# Patient Record
Sex: Male | Born: 1985 | Race: White | Hispanic: No | Marital: Single | Smoking: Never smoker
Health system: Southern US, Community
[De-identification: ages and names within clinical notes are randomized; demographics above are authoritative.]

## PROBLEM LIST (undated history)

## (undated) DIAGNOSIS — J302 Other seasonal allergic rhinitis: Secondary | ICD-10-CM

## (undated) DIAGNOSIS — Z8739 Personal history of other diseases of the musculoskeletal system and connective tissue: Secondary | ICD-10-CM

## (undated) DIAGNOSIS — Q210 Ventricular septal defect: Secondary | ICD-10-CM

## (undated) HISTORY — PX: TONSILLECTOMY: SUR1361

## (undated) HISTORY — DX: Other seasonal allergic rhinitis: J30.2

## (undated) HISTORY — DX: Ventricular septal defect: Q21.0

---
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
## 2009-05-25 ENCOUNTER — Emergency Department: Payer: Self-pay | Admitting: Emergency Medicine

## 2010-02-28 ENCOUNTER — Emergency Department (HOSPITAL_COMMUNITY): Admission: EM | Admit: 2010-02-28 | Discharge: 2010-02-28 | Payer: Self-pay | Admitting: Emergency Medicine

## 2010-07-18 ENCOUNTER — Ambulatory Visit: Payer: Self-pay | Admitting: Internal Medicine

## 2010-07-18 DIAGNOSIS — R21 Rash and other nonspecific skin eruption: Secondary | ICD-10-CM | POA: Insufficient documentation

## 2010-09-17 NOTE — Assessment & Plan Note (Signed)
Summary: NEW PT TO EST/JRR R/S FROM 07/22/10   Vital Signs:  Patient profile:   25 year old male Height:      68 inches Weight:      151.25 pounds BMI:     23.08 Temp:     98.2 degrees F oral Pulse rate:   60 / minute Pulse rhythm:   regular BP sitting:   118 / 78  (left arm) Cuff size:   regular  Vitals Entered By: Selena Batten Dance CMA (AAMA) (July 18, 2010 10:14 AM) CC: New patient to establish care   History of Present Illness: CC: new patinet, establish  1. works night shift x 2 1/2 years.  Started taking men's health vitamin.  wakes up at 7:30pm, work starts at 9pm, gets off at 6:30am.  asks what else he can do to stay healthy given working nights.  2.  rash - noted rash anterior forearms bilaterally, doesn't spread.  Gets worse when washing hands at work.  Itchy, worse during winter, better during summer.  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  h/o eczema on legs.  would like testicles checked.  preventative: flu - declines tetanus shot - 05/25/2009 Td has had blood work done before - for mono.   seat belt 100% time. monogamous, no h/o STDs.  -  Date:  06/14/2009    TD booster Td  Current Medications (verified): 1)  Triamcinolone Acetonide 0.1 % Oint (Triamcinolone Acetonide) .... Apply To Affected Area Forearms Bilaterally Twice Daily For 2 Wks, Large Op  Allergies (verified): 1)  ! 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    Physical Exam  General:  Well-developed,well-nourished,in no acute distress; alert,appropriate and cooperative throughout examination Head:  Normocephalic and atraumatic without obvious abnormalities. No apparent alopecia or balding. Eyes:  No corneal or conjunctival inflammation noted. EOMI. Perrla.  Ears:  External ear exam shows no significant lesions or deformities.  Otoscopic examination reveals clear canals, tympanic membranes are intact bilaterally without bulging, retraction, inflammation or discharge. Hearing is grossly normal bilaterally. Nose:  External nasal examination shows no deformity or inflammation. Nasal mucosa are pink and moist without lesions or exudates. Mouth:  Oral mucosa and oropharynx without lesions or exudates.  Teeth in good repair. Neck:  No deformities, masses, or tenderness noted. no LAD Lungs:  Normal respiratory effort, chest expands symmetrically. Lungs are clear to auscultation, no crackles or wheezes. Heart:  Normal rate and regular rhythm. S1 and S2 normal without gallop, click, rub or other extra sounds.  no murmur appreciated Abdomen:  Bowel sounds positive,abdomen soft and non-tender without masses, organomegaly or hernias noted. Genitalia:  Testes bilaterally descended without nodularity, tenderness or masses. No scrotal masses or lesions. No penis lesions or urethral discharge. Msk:  No deformity or scoliosis noted of thoracic or lumbar spine.   Pulses:  2+ rad pulses Extremities:  no pedal edema Neurologic:  CN grossly intact, station and gait intact Skin:  macular erythematous scaly rash on anterior medial forearms bilaterally, some macules with central clearing, occasionally pruritic Psych:  full affect, pleasant  conversant   Impression & Recommendations:  Problem # 1:  SKIN RASH (ICD-782.1) description consistent with eczema however clinically looks like tinea corporis.  KOH - negative.  treat with steroids, let us know if not improving in 1-2 wks.  His updated medication list for this problem includes:    Triamcinolone Acetonide 0.1 % Oint (Triamcinolone acetonide) .Marland Kitchen... Apply to affected area forearms bilaterally twice daily for 2 wks, large op  Orders: EMR Electrical engineer Code [facility])  Problem # 2:  HEALTH MAINTENANCE EXAM (ICD-V70.0) Reviewed preventive care protocols, scheduled due services, and updated immunizations.  declines flu, utd Td, will need Tdap next time.  Complete Medication List: 1)  Daily Mens Health Formula Tabs (Multiple vitamins-minerals) .... One daily 2)  Triamcinolone Acetonide 0.1 % Oint (Triamcinolone acetonide) .... Apply to affected area forearms bilaterally twice daily for 2 wks, large op  Patient Instructions: 1)  Triamcinolone cream for arms twice daily for 2 wks then stop. 2)  Return in 1-2 years for next CPE, sooner if needed. 3)  Good to meet you today.  Call clinic with quesitons. Prescriptions: TRIAMCINOLONE ACETONIDE 0.1 % OINT (TRIAMCINOLONE ACETONIDE) apply to affected area forearms bilaterally twice daily for 2 wks, large op  #1 x 3   Entered and Authorized by:   Eustaquio Boyden  MD   Signed by:   Eustaquio Boyden  MD on 07/18/2010   Method used:   Electronically to        [id number] (retail)       [address]       [place], [place]  [address]       Ph: [phone]       Fax: [phone]   RxID:   [id number]    Orders Added: 1)  New Patient 18-39 years [99385] 2)  EMR Misc Charge Code [EMRMisc]    Prior Medications: Current Allergies (reviewed today): ! PCN  Laboratory Results    Other Tests  Skin KOH: Negative    Appended Document: NEW PT TO EST/JRR R/S FROM 07/22/10 please call and notify skin KOH  negative for fungus- to try triamcinolone and let us know if not better or worsening.  Appended Document: NEW PT TO EST/JRR R/S FROM 07/22/10 Advised pt.

## 2010-11-27 ENCOUNTER — Encounter: Payer: Self-pay | Admitting: Family Medicine

## 2010-11-28 ENCOUNTER — Encounter: Payer: Self-pay | Admitting: Family Medicine

## 2010-11-28 ENCOUNTER — Ambulatory Visit (INDEPENDENT_AMBULATORY_CARE_PROVIDER_SITE_OTHER): Payer: Self-pay | Admitting: Family Medicine

## 2010-11-28 DIAGNOSIS — J309 Allergic rhinitis, unspecified: Secondary | ICD-10-CM

## 2010-11-28 DIAGNOSIS — Z2089 Contact with and (suspected) exposure to other communicable diseases: Secondary | ICD-10-CM

## 2010-11-28 DIAGNOSIS — Z20818 Contact with and (suspected) exposure to other bacterial communicable diseases: Secondary | ICD-10-CM

## 2010-11-28 DIAGNOSIS — J301 Allergic rhinitis due to pollen: Secondary | ICD-10-CM | POA: Insufficient documentation

## 2010-11-28 DIAGNOSIS — J029 Acute pharyngitis, unspecified: Secondary | ICD-10-CM

## 2010-11-28 LAB — POCT RAPID STREP A (OFFICE): Rapid Strep A Screen: POSITIVE — AB

## 2010-11-28 MED ORDER — MOMETASONE FUROATE 50 MCG/ACT NA SUSP: 2.0000 | Freq: Every day | NASAL | Status: DC

## 2010-11-28 MED ORDER — AZITHROMYCIN 250 MG PO TABS: ORAL_TABLET | ORAL | Status: AC

## 2010-11-28 MED ORDER — MONTELUKAST SODIUM 10 MG PO TABS: 10.0000 mg | ORAL_TABLET | Freq: Every day | ORAL | Status: DC

## 2010-11-28 MED ORDER — FLUTICASONE PROPIONATE 50 MCG/ACT NA SUSP: 2.0000 | Freq: Every day | NASAL | Status: DC

## 2010-11-28 NOTE — Assessment & Plan Note (Signed)
Rapid strep weakly positive, mild LAD, along with recent strep exposure.  Treat with zpack as allergic to PCN.

## 2010-11-28 NOTE — Progress Notes (Signed)
  Subjective:    Patient ID: Daniel Cooper, male    DOB: [birth year], 25 y.o.   MRN: [id number]  HPI CC: allegies?  Last week allergies worse also associated with HA, tightness in back of throat with tickle.  + dripping, congestion, pressure.  Works night shift, also cuts grass during day.  Mother mentioned seeing allergist.  Both RN and congestion.  Constant dripping out of nose, watery.  Mild PNDrainage.  Mild cough.  + sneezing.  + watery, itchy eyes.  Using allegra, pseudophed, and flonase nasal spray (prescribed by [facility]).  Along w mucinex.Has used claritin/zyrtec in past. Also using saline spray.  Using for about 1-2 mo.  Doesn't wear mask.  Also using eye drops (clear eyes?).  Last weekend, mother with strep throat.  Would like to be tested.  No fevers/chills, abd pain, n/v/d, SOB, wheezing.  Review of Systems Per HPI    Objective:   Physical Exam  Constitutional: He appears well-developed and well-nourished. No distress.  HENT:  Head: Normocephalic and atraumatic.  Right Ear: Hearing, tympanic membrane and external ear normal.  Left Ear: Hearing, tympanic membrane and external ear normal.  Nose: Mucosal edema and rhinorrhea present. No nasal deformity. No epistaxis. Right sinus exhibits no maxillary sinus tenderness and no frontal sinus tenderness. Left sinus exhibits no maxillary sinus tenderness and no frontal sinus tenderness.  Mouth/Throat: Uvula is midline and mucous membranes are normal. Posterior oropharyngeal erythema present. No oropharyngeal exudate or posterior oropharyngeal edema.       Cerumen in canals bilaterally Erythematous oropharynx, mild PND  Eyes: EOM are normal. Pupils are equal, round, and reactive to light. Right conjunctiva is injected. Right conjunctiva has no hemorrhage. Left conjunctiva is injected. Left conjunctiva has no hemorrhage.  Neck: Normal range of motion. Neck supple. No thyromegaly present.  Cardiovascular: Normal rate, regular rhythm, normal  heart sounds and intact distal pulses.   No murmur heard. Pulmonary/Chest: Effort normal and breath sounds normal. No respiratory distress. He has no wheezes. He has no rales.  Lymphadenopathy:    He has cervical adenopathy.       Right cervical: Superficial cervical adenopathy present.       Left cervical: No superficial cervical adenopathy present.          Assessment & Plan:

## 2010-11-28 NOTE — Patient Instructions (Addendum)
Good to see you today.  Call us with questions. For allergies - continue flonase 2 sprays in each nostril daily.  Continue allegra daily.  Continue mucinex +/- pseudophed.  Start singulair daily. If not better in next several days, change flonase to nasonex. If not better in next few weeks, give me a call for referral to allergist. Use mask when bush hogging, and ideally when mowing lawn. Strep positive - treat with zpack.

## 2010-11-28 NOTE — Assessment & Plan Note (Signed)
Already using daily antihistamine, INS.  Add singulair, rec regular use of nasal saline irrigation, and may change INS if no improvement after regular use.  If not better with these measures, referral to allergist for eval for xolair/IgE.

## 2010-11-29 ENCOUNTER — Other Ambulatory Visit: Payer: Self-pay | Admitting: *Deleted

## 2010-11-29 NOTE — Telephone Encounter (Signed)
Pt has been taking z-pack since Thursday.  He is now in [place] and left the zpack here.  He is asking if we can call in another one.  OK per Dr. Reece Agar.  Z-pack called to [facility] [place], phone [phone].

## 2010-11-29 NOTE — Telephone Encounter (Signed)
Noted. Thanks.

## 2011-02-23 ENCOUNTER — Other Ambulatory Visit: Payer: Self-pay | Admitting: Family Medicine

## 2011-04-05 ENCOUNTER — Other Ambulatory Visit: Payer: Self-pay | Admitting: Family Medicine

## 2011-05-15 ENCOUNTER — Ambulatory Visit: Payer: 59 | Admitting: Family Medicine

## 2011-06-04 ENCOUNTER — Ambulatory Visit (INDEPENDENT_AMBULATORY_CARE_PROVIDER_SITE_OTHER): Payer: 59 | Admitting: Family Medicine

## 2011-06-04 ENCOUNTER — Encounter: Payer: Self-pay | Admitting: Family Medicine

## 2011-06-04 VITALS — BP 116/72 | HR 64 | Temp 98.6°F | Wt 149.8 lb

## 2011-06-04 DIAGNOSIS — J40 Bronchitis, not specified as acute or chronic: Secondary | ICD-10-CM

## 2011-06-04 MED ORDER — AZITHROMYCIN 250 MG PO TABS: ORAL_TABLET | ORAL | Status: AC

## 2011-06-04 NOTE — Progress Notes (Signed)
  Subjective:    Patient ID: Daniel Cooper, male    DOB: 1986-01-12, 25 y.o.   MRN: [id number]  HPI CC: cough  4wks ago felt ill - ST, RN, chest tightness, HA.  After 1 wk improved, but lingering dry cough.  Productive in am of green sputum, then throat gets dry at end of day.  Last few days getting worse - more coughing in am and fatigue.  Last night didn't sleep well.  Feeling worn down.  HA improved.  So far has tried mucinex DM which helped cough.  No fever/chills, abd pain, n/v, rashes, ear pain or tooth pain.  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  + sick contacts at work.  No smokers at home.  Did have work induced asthma, now improved.  Review of Systems Per HPI    Objective:   Physical Exam  Nursing note and vitals reviewed. Constitutional: He appears well-developed and well-nourished. No distress.  HENT:  Head: Normocephalic and atraumatic.  Right Ear: Hearing, tympanic membrane, external ear and ear canal normal.  Left Ear: Hearing, tympanic membrane and external ear normal.  Nose: Nose normal. No mucosal edema or rhinorrhea. Right sinus exhibits no maxillary sinus tenderness and no frontal sinus tenderness. Left sinus exhibits no maxillary sinus tenderness and no frontal sinus tenderness.  Mouth/Throat: Uvula is midline and mucous membranes are normal. Posterior oropharyngeal erythema present. No oropharyngeal exudate, posterior oropharyngeal edema or tonsillar abscesses.  Eyes: Conjunctivae and EOM are normal. Pupils are equal, round, and reactive to light. No scleral icterus.  Neck: Normal range of motion. Neck supple.  Cardiovascular: Normal rate, regular rhythm, normal heart sounds and intact distal pulses.   No murmur heard. Pulmonary/Chest: Effort normal and breath sounds normal. No respiratory distress. He has no wheezes. He has no rales.  Musculoskeletal: He exhibits no edema.  Lymphadenopathy:    He has no cervical adenopathy.  Skin: Skin is warm and dry. No  rash noted.  Psychiatric: He has a normal mood and affect.          Assessment & Plan:

## 2011-06-04 NOTE — Patient Instructions (Signed)
This could be post infectious (post viral) cough or bronchitis. Take Zpack. Push fluids and plenty of rest. May continue mucinex if needed. Please return if you are not improving as expected, or if you have high fevers (>101.5) or difficulty swallowing or worsening productive cough. Call clinic with questions.  Good to see you today.

## 2011-06-04 NOTE — Assessment & Plan Note (Signed)
Postviral vs continued bronchitis. As worsened last 2 days, will cover with zpack for atypicals. Update [place] if not improving as expected. Pt declined cough syrup today.

## 2012-04-22 ENCOUNTER — Other Ambulatory Visit: Payer: Self-pay | Admitting: Family Medicine

## 2012-09-07 ENCOUNTER — Telehealth: Payer: Self-pay

## 2012-09-07 NOTE — Telephone Encounter (Signed)
Pt called to get correct spelling of PCP's name. Gave Dr Eustaquio Boyden.

## 2013-01-05 ENCOUNTER — Telehealth: Payer: Self-pay | Admitting: Family Medicine

## 2013-01-05 NOTE — Telephone Encounter (Signed)
Will see then. 

## 2013-01-05 NOTE — Telephone Encounter (Signed)
Patient Information:  Caller Name: Riely  Phone: [phone]  Patient: Daniel Cooper, Daniel Cooper  Gender: Male  DOB: 05-23-1986  Age: 27 Years  PCP: Eustaquio Boyden [facility])  Office Follow Up:  Does the office need to follow up with this patient?: No  Instructions For The Office: N/A  RN Note:  pt is requesting medication.  RN advised pt to be seen (pt has not been in the office since 2012)  Symptoms  Reason For Call & Symptoms: allergy medications are not helping anymore; runny nose and tight feeling behind nose  Reviewed Health History In EMR: Yes  Reviewed Medications In EMR: Yes  Reviewed Allergies In EMR: Yes  Reviewed Surgeries / Procedures: Yes  Date of Onset of Symptoms: 01/03/2013  Treatments Tried: singulair, mucinex, allegra  Treatments Tried Worked: No  Guideline(s) Used:  Hay Fever - Nasal Allergies  Disposition Per Guideline:   See Within 2 Weeks in Office  Reason For Disposition Reached:   Nasal allergies occur year-round  Advice Given:  N/A  Patient Will Follow Care Advice:  YES  Appointment Scheduled:  01/06/2013 09:00:00 Appointment Scheduled Provider:  Eustaquio Boyden (Family Practice)

## 2013-01-06 ENCOUNTER — Ambulatory Visit (INDEPENDENT_AMBULATORY_CARE_PROVIDER_SITE_OTHER): Payer: 59 | Admitting: Family Medicine

## 2013-01-06 ENCOUNTER — Encounter: Payer: Self-pay | Admitting: Family Medicine

## 2013-01-06 VITALS — BP 124/82 | HR 60 | Temp 98.4°F | Wt 154.5 lb

## 2013-01-06 DIAGNOSIS — M25539 Pain in unspecified wrist: Secondary | ICD-10-CM | POA: Insufficient documentation

## 2013-01-06 DIAGNOSIS — J309 Allergic rhinitis, unspecified: Secondary | ICD-10-CM

## 2013-01-06 MED ORDER — AZELASTINE HCL 0.1 % NA SOLN: 1.0000 | Freq: Two times a day (BID) | NASAL | Status: DC

## 2013-01-06 NOTE — Assessment & Plan Note (Signed)
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 Treat with stretching exerises and OTC NSAIDs - update if not improving as epoected. Grip strenght intact, no numbness or weakness on exam today.

## 2013-01-06 NOTE — Progress Notes (Signed)
  Subjective:    Patient ID: Daniel Cooper, male    DOB: 1986-05-10, 27 y.o.   MRN: [id number]  HPI CC: allergic rhintiis  Did well last year allergy wise.  Takes allegra, nasal saline, and singulair.  Also using plain mucinex 1200mg  bid.  Recently switched from claritin to allegra.  Not currently on flonase - off for the last year - felt that it was causing worsening allergy flares.  Attacks seem to happen more at work, also after mowing grass.  3-4 times nightly getting allergy attacks where feels itchy watery eyes, tightness and pressure in sinuses, rhinorrhea, PNdrainage, sneezing.  No cough or fever/chills.  Works night shift as Engineering geologist.  Also - bilateral wrist aches worsening recently.  Started after used nail gun - repetitive motion on hand.  Worse with push ups.  Past Medical History  Diagnosis Date  . VSD (ventricular septal defect)     told closed  . Routine general medical examination at a health care facility   . Rash and other nonspecific skin eruption   . Seasonal allergies      Review of Systems Per HPI    Objective:   Physical Exam  Nursing note and vitals reviewed. Constitutional: He appears well-developed and well-nourished. No distress.  HENT:  Head: Normocephalic and atraumatic.  Right Ear: Hearing, tympanic membrane, external ear and ear canal normal.  Left Ear: Hearing, tympanic membrane, external ear and ear canal normal.  Nose: Mucosal edema and rhinorrhea present. Right sinus exhibits no maxillary sinus tenderness and no frontal sinus tenderness. Left sinus exhibits no maxillary sinus tenderness and no frontal sinus tenderness.  Mouth/Throat: Uvula is midline, oropharynx is clear and moist and mucous membranes are normal. No oropharyngeal exudate, posterior oropharyngeal edema, posterior oropharyngeal erythema or tonsillar abscesses.  Oropharyngeal cobblestoning Slight conjunctival injection  Eyes: Conjunctivae and EOM are normal. Pupils are equal,  round, and reactive to light. No scleral icterus.  Neck: Normal range of motion. Neck supple.  Cardiovascular: Normal rate, regular rhythm, normal heart sounds and intact distal pulses.   No murmur heard. Pulmonary/Chest: Effort normal and breath sounds normal. No respiratory distress. He has no wheezes. He has no rales.  Musculoskeletal:  Mild discomfort to palpation bilateral dorsal medial wrists, o/w WNL.  No deformity or dislocation.  Lymphadenopathy:    He has no cervical adenopathy.  Skin: Skin is warm and dry. No rash noted.       Assessment & Plan:

## 2013-01-06 NOTE — Assessment & Plan Note (Signed)
Recently deteriorating. flonase worsened sxs. Add on astelin, continue other meds. Discussed allergen avoidance. If not improving, will refer to allergist for further eval, discussion of sublingual immunotherapy vs allergy shots. Pt agrees with plan.

## 2013-01-06 NOTE — Patient Instructions (Signed)
Good to see you today. Continue meds as up to now. Add on astelin nasal spray twice daily - 1 to 2 sprays. If not better with this, let me know for referral to allergist for testing and possible further treatment.

## 2013-05-16 ENCOUNTER — Other Ambulatory Visit: Payer: Self-pay | Admitting: Family Medicine

## 2014-03-06 ENCOUNTER — Other Ambulatory Visit: Payer: Self-pay | Admitting: Family Medicine

## 2014-04-17 ENCOUNTER — Other Ambulatory Visit: Payer: Self-pay | Admitting: Family Medicine

## 2014-05-02 ENCOUNTER — Encounter: Payer: Self-pay | Admitting: Family Medicine

## 2014-05-02 ENCOUNTER — Ambulatory Visit (INDEPENDENT_AMBULATORY_CARE_PROVIDER_SITE_OTHER): Payer: 59 | Admitting: Family Medicine

## 2014-05-02 VITALS — BP 132/76 | HR 61 | Temp 98.1°F | Resp 16 | Ht 68.0 in | Wt 161.1 lb

## 2014-05-02 DIAGNOSIS — J302 Other seasonal allergic rhinitis: Secondary | ICD-10-CM

## 2014-05-02 DIAGNOSIS — J3089 Other allergic rhinitis: Secondary | ICD-10-CM

## 2014-05-02 MED ORDER — MONTELUKAST SODIUM 10 MG PO TABS: ORAL_TABLET | ORAL | Status: DC

## 2014-05-02 NOTE — Patient Instructions (Addendum)
I've refilled singulair for you. Good to see you, I think you are doing well.  Allergic Rhinitis Allergic rhinitis is when the mucous membranes in the nose respond to allergens. Allergens are particles in the air that cause your body to have an allergic reaction. This causes you to release allergic antibodies. Through a chain of events, these eventually cause you to release histamine into the blood stream. Although meant to protect the body, it is this release of histamine that causes your discomfort, such as frequent sneezing, congestion, and an itchy, runny nose.  CAUSES  Seasonal allergic rhinitis (hay fever) is caused by pollen allergens that may come from grasses, trees, and weeds. Year-round allergic rhinitis (perennial allergic rhinitis) is caused by allergens such as house dust mites, pet dander, and mold spores.  SYMPTOMS   Nasal stuffiness (congestion).  Itchy, runny nose with sneezing and tearing of the eyes. DIAGNOSIS  Your health care provider can help you determine the allergen or allergens that trigger your symptoms. If you and your health care provider are unable to determine the allergen, skin or blood testing may be used. TREATMENT  Allergic rhinitis does not have a cure, but it can be controlled by:  Medicines and allergy shots (immunotherapy).  Avoiding the allergen. Hay fever may often be treated with antihistamines in pill or nasal spray forms. Antihistamines block the effects of histamine. There are over-the-counter medicines that may help with nasal congestion and swelling around the eyes. Check with your health care provider before taking or giving this medicine.  If avoiding the allergen or the medicine prescribed do not work, there are many new medicines your health care provider can prescribe. Stronger medicine may be used if initial measures are ineffective. Desensitizing injections can be used if medicine and avoidance does not work. Desensitization is when a  patient is given ongoing shots until the body becomes less sensitive to the allergen. Make sure you follow up with your health care provider if problems continue. HOME CARE INSTRUCTIONS It is not possible to completely avoid allergens, but you can reduce your symptoms by taking steps to limit your exposure to them. It helps to know exactly what you are allergic to so that you can avoid your specific triggers. SEEK MEDICAL CARE IF:   You have a fever.  You develop a cough that does not stop easily (persistent).  You have shortness of breath.  You start wheezing.  Symptoms interfere with normal daily activities. Document Released: 04/29/2001 Document Revised: 08/09/2013 Document Reviewed: 04/11/2013 [facility] Patient Information 2015 [place], [place]. This information is not intended to replace advice given to you by your health care provider. Make sure you discuss any questions you have with your health care provider.

## 2014-05-02 NOTE — Progress Notes (Signed)
   BP 132/76  Pulse 61  Temp(Src) 98.1 F (36.7 C) (Oral)  Resp 16  Ht  (1.727 m)  Wt 161 lb 1.9 oz (73.084 kg)  BMI 24.50 kg/m2  SpO2 99%   CC: med refill  Subjective:    Patient ID: Daniel Cooper, male    DOB: Dec 25, 1985, 28 y.o.   MRN: [id number]  HPI: Daniel Cooper is a 28 y.o. male presenting on 05/02/2014 for Medication Refill   Presents today for med refill visit. Works at [employer] works 3rd shift.  Seasonal allergies - regularly takes OTC antihistamine (zyrtec, claritin or allegra), singulair. Doesn't regularly use benadryl.   Relevant past medical, surgical, family and social history reviewed and updated as indicated.  Allergies and medications reviewed and updated. Current Outpatient Prescriptions on File Prior to Visit  Medication Sig  . diphenhydrAMINE (BENADRYL) 25 MG tablet Take 25 mg by mouth every 6 (six) hours as needed for itching.  . fexofenadine (ALLEGRA) 180 MG tablet Take 180 mg by mouth daily.    Marland Kitchen guaiFENesin (MUCINEX) 600 MG 12 hr tablet Take 1,200 mg by mouth 2 (two) times daily.   No current facility-administered medications on file prior to visit.    Review of Systems Per HPI unless specifically indicated above    Objective:    BP 132/76  Pulse 61  Temp(Src) 98.1 F (36.7 C) (Oral)  Resp 16  Ht  (1.727 m)  Wt 161 lb 1.9 oz (73.084 kg)  BMI 24.50 kg/m2  SpO2 99%  Physical Exam  Nursing note and vitals reviewed. Constitutional: He appears well-developed and well-nourished. No distress.  HENT:  Head: Normocephalic and atraumatic.  Right Ear: Hearing, tympanic membrane, external ear and ear canal normal.  Left Ear: Hearing, tympanic membrane, external ear and ear canal normal.  Nose: Nose normal. No mucosal edema or rhinorrhea. Right sinus exhibits no maxillary sinus tenderness and no frontal sinus tenderness. Left sinus exhibits no maxillary sinus tenderness and no frontal sinus  tenderness.  Mouth/Throat: Uvula is midline, oropharynx is clear and moist and mucous membranes are normal. No oropharyngeal exudate, posterior oropharyngeal edema, posterior oropharyngeal erythema or tonsillar abscesses.  Eyes: Conjunctivae and EOM are normal. Pupils are equal, round, and reactive to light. No scleral icterus.  Neck: Normal range of motion. Neck supple. No thyromegaly present.  Cardiovascular: Normal rate, regular rhythm, normal heart sounds and intact distal pulses.   No murmur heard. Pulmonary/Chest: Effort normal and breath sounds normal. No respiratory distress. He has no wheezes. He has no rales.  Lymphadenopathy:    He has no cervical adenopathy.  Skin: Skin is warm and dry. No rash noted.       Assessment & Plan:   Problem List Items Addressed This Visit   Allergic rhinitis - Primary     Stable on current regimen. Refilled singulair, continue OTC antihistamine Declines flu shot today.        Follow up plan: Return as needed.

## 2014-05-02 NOTE — Progress Notes (Signed)
Pre visit review using our clinic review tool, if applicable. No additional management support is needed unless otherwise documented below in the visit note. 

## 2014-05-02 NOTE — Assessment & Plan Note (Signed)
Stable on current regimen. Refilled singulair, continue OTC antihistamine Declines flu shot today.

## 2014-08-18 DIAGNOSIS — Z8739 Personal history of other diseases of the musculoskeletal system and connective tissue: Secondary | ICD-10-CM

## 2014-08-18 HISTORY — DX: Personal history of other diseases of the musculoskeletal system and connective tissue: Z87.39

## 2014-09-02 ENCOUNTER — Inpatient Hospital Stay (HOSPITAL_COMMUNITY)
Admission: EM | Admit: 2014-09-02 | Discharge: 2014-09-05 | DRG: 558 | Disposition: A | Discharge status: Home / Self Care | Payer: 59 | Attending: Internal Medicine | Admitting: Internal Medicine

## 2014-09-02 ENCOUNTER — Encounter (HOSPITAL_COMMUNITY): Payer: Self-pay | Admitting: Emergency Medicine

## 2014-09-02 DIAGNOSIS — M6282 Rhabdomyolysis: Secondary | ICD-10-CM | POA: Diagnosis present

## 2014-09-02 DIAGNOSIS — J302 Other seasonal allergic rhinitis: Secondary | ICD-10-CM | POA: Diagnosis present

## 2014-09-02 DIAGNOSIS — Z888 Allergy status to other drugs, medicaments and biological substances status: Secondary | ICD-10-CM | POA: Diagnosis not present

## 2014-09-02 DIAGNOSIS — R740 Nonspecific elevation of levels of transaminase and lactic acid dehydrogenase [LDH]: Secondary | ICD-10-CM | POA: Diagnosis present

## 2014-09-02 DIAGNOSIS — Z880 Allergy status to penicillin: Secondary | ICD-10-CM

## 2014-09-02 DIAGNOSIS — R7401 Elevation of levels of liver transaminase levels: Secondary | ICD-10-CM | POA: Diagnosis present

## 2014-09-02 HISTORY — DX: Personal history of other diseases of the musculoskeletal system and connective tissue: Z87.39

## 2014-09-02 LAB — HEPATIC FUNCTION PANEL
ALT: 252 U/L — ABNORMAL HIGH (ref 0–53)
AST: 1093 U/L — AB (ref 0–37)
Albumin: 3.6 g/dL (ref 3.5–5.2)
Alkaline Phosphatase: 38 U/L — ABNORMAL LOW (ref 39–117)
BILIRUBIN DIRECT: 0.3 mg/dL (ref 0.0–0.3)
BILIRUBIN INDIRECT: 1.3 mg/dL — AB (ref 0.3–0.9)
TOTAL PROTEIN: 5.4 g/dL — AB (ref 6.0–8.3)
Total Bilirubin: 1.6 mg/dL — ABNORMAL HIGH (ref 0.3–1.2)

## 2014-09-02 LAB — CBC WITH DIFFERENTIAL/PLATELET
Basophils Absolute: 0 10*3/uL (ref 0.0–0.1)
Basophils Relative: 0 % (ref 0–1)
Eosinophils Absolute: 0.1 10*3/uL (ref 0.0–0.7)
Eosinophils Relative: 1 % (ref 0–5)
HCT: 42.9 % (ref 39.0–52.0)
Hemoglobin: 15.3 g/dL (ref 13.0–17.0)
LYMPHS PCT: 19 % (ref 12–46)
Lymphs Abs: 1.2 10*3/uL (ref 0.7–4.0)
MCH: 30.5 pg (ref 26.0–34.0)
MCHC: 35.7 g/dL (ref 30.0–36.0)
MCV: 85.5 fL (ref 78.0–100.0)
MONOS PCT: 7 % (ref 3–12)
Monocytes Absolute: 0.5 10*3/uL (ref 0.1–1.0)
NEUTROS ABS: 4.5 10*3/uL (ref 1.7–7.7)
Neutrophils Relative %: 73 % (ref 43–77)
Platelets: 163 10*3/uL (ref 150–400)
RBC: 5.02 MIL/uL (ref 4.22–5.81)
RDW: 12.5 % (ref 11.5–15.5)
WBC: 6.2 10*3/uL (ref 4.0–10.5)

## 2014-09-02 LAB — CBC
HEMATOCRIT: 41.4 % (ref 39.0–52.0)
HEMOGLOBIN: 14.2 g/dL (ref 13.0–17.0)
MCH: 29.2 pg (ref 26.0–34.0)
MCHC: 34.3 g/dL (ref 30.0–36.0)
MCV: 85 fL (ref 78.0–100.0)
PLATELETS: 145 10*3/uL — AB (ref 150–400)
RBC: 4.87 MIL/uL (ref 4.22–5.81)
RDW: 12.5 % (ref 11.5–15.5)
WBC: 4.6 10*3/uL (ref 4.0–10.5)

## 2014-09-02 LAB — CK

## 2014-09-02 LAB — URINALYSIS, ROUTINE W REFLEX MICROSCOPIC
Bilirubin Urine: NEGATIVE
GLUCOSE, UA: NEGATIVE mg/dL
Ketones, ur: NEGATIVE mg/dL
Leukocytes, UA: NEGATIVE
Nitrite: NEGATIVE
Protein, ur: 30 mg/dL — AB
Specific Gravity, Urine: 1.022 (ref 1.005–1.030)
UROBILINOGEN UA: 1 mg/dL (ref 0.0–1.0)
pH: 6.5 (ref 5.0–8.0)

## 2014-09-02 LAB — CREATININE, SERUM
CREATININE: 0.83 mg/dL (ref 0.50–1.35)
GFR calc Af Amer: >90 mL/min (ref >90)

## 2014-09-02 LAB — BASIC METABOLIC PANEL
ANION GAP: 7 (ref 5–15)
BUN: 23 mg/dL (ref 6–23)
CALCIUM: 8.8 mg/dL (ref 8.4–10.5)
CO2: 27 mmol/L (ref 19–32)
Chloride: 104 mEq/L (ref 96–112)
Creatinine, Ser: 1.16 mg/dL (ref 0.50–1.35)
GFR calc non Af Amer: 85 mL/min — ABNORMAL LOW (ref >90)
Glucose, Bld: 100 mg/dL — ABNORMAL HIGH (ref 70–99)
POTASSIUM: 4.1 mmol/L (ref 3.5–5.1)
Sodium: 138 mmol/L (ref 135–145)

## 2014-09-02 LAB — URINE MICROSCOPIC-ADD ON

## 2014-09-02 LAB — TROPONIN I

## 2014-09-02 LAB — TSH: TSH: 0.78 u[IU]/mL (ref 0.350–4.500)

## 2014-09-02 MED ORDER — ZOLPIDEM TARTRATE 5 MG PO TABS: 5.0000 mg | ORAL_TABLET | Freq: Every evening | ORAL | Status: DC | PRN

## 2014-09-02 MED ORDER — MONTELUKAST SODIUM 10 MG PO TABS
10.0000 mg | ORAL_TABLET | Freq: Every day | ORAL | Status: DC
  Filled 2014-09-02 (×2): qty 1

## 2014-09-02 MED ORDER — OXYCODONE HCL 5 MG PO TABS: 5.0000 mg | ORAL_TABLET | ORAL | Status: DC | PRN

## 2014-09-02 MED ORDER — ACETAMINOPHEN 325 MG PO TABS: 650.0000 mg | ORAL_TABLET | Freq: Four times a day (QID) | ORAL | Status: DC | PRN

## 2014-09-02 MED ORDER — ONDANSETRON HCL 4 MG/2ML IJ SOLN: 4.0000 mg | Freq: Four times a day (QID) | INTRAMUSCULAR | Status: DC | PRN

## 2014-09-02 MED ORDER — ONDANSETRON HCL 4 MG PO TABS: 4.0000 mg | ORAL_TABLET | Freq: Four times a day (QID) | ORAL | Status: DC | PRN

## 2014-09-02 MED ORDER — SENNOSIDES-DOCUSATE SODIUM 8.6-50 MG PO TABS: 1.0000 | ORAL_TABLET | Freq: Every evening | ORAL | Status: DC | PRN

## 2014-09-02 MED ORDER — SODIUM CHLORIDE 0.9 % IV BOLUS (SEPSIS)
1000.0000 mL | Freq: Once | INTRAVENOUS | Status: AC
  Administered 2014-09-02: 1000 mL via INTRAVENOUS

## 2014-09-02 MED ORDER — ALUM & MAG HYDROXIDE-SIMETH 200-200-20 MG/5ML PO SUSP: 30.0000 mL | Freq: Four times a day (QID) | ORAL | Status: DC | PRN

## 2014-09-02 MED ORDER — ACETAMINOPHEN 650 MG RE SUPP: 650.0000 mg | Freq: Four times a day (QID) | RECTAL | Status: DC | PRN

## 2014-09-02 MED ORDER — SODIUM CHLORIDE 0.9 % IV SOLN
INTRAVENOUS | Status: DC
  Administered 2014-09-02 – 2014-09-04 (×10): via INTRAVENOUS

## 2014-09-02 MED ORDER — MORPHINE SULFATE 2 MG/ML IJ SOLN: 2.0000 mg | INTRAMUSCULAR | Status: DC | PRN

## 2014-09-02 MED ORDER — ENOXAPARIN SODIUM 40 MG/0.4ML ~~LOC~~ SOLN
40.0000 mg | SUBCUTANEOUS | Status: DC
  Administered 2014-09-02 – 2014-09-04 (×3): 40 mg via SUBCUTANEOUS
  Filled 2014-09-02 (×4): qty 0.4

## 2014-09-02 NOTE — ED Provider Notes (Signed)
Patient seen/examined in the Emergency Department in conjunction with Midlevel Provider  Patient reports muscle pain - he is in rhabdomyolysis Exam : awake/alert, no distress Plan: admit to medicine for IV fluid rehydration   Joya Gaskinsonald W Ulus Hazen, MD 09/02/14 [phone]

## 2014-09-02 NOTE — ED Notes (Signed)
Attempted report 

## 2014-09-02 NOTE — ED Provider Notes (Signed)
CSN: [id number]     Arrival date & time 09/02/14  0241 History   First MD Initiated Contact with Patient 09/02/14 0410     Chief Complaint  Patient presents with  . Muscle Pain     (Consider location/radiation/quality/duration/timing/severity/associated sxs/prior Treatment) HPI Comments: Patient is a 29 year old male with no past medical history who presents with generalized muscle pain that started yesterday. Patient reports not working out in a while and decided to do 2 strenuous workouts for the past 2 days. Patient reports generalized muscle pain that is constant and severe without radiation. Patient became concerned when his urine was "coke colored." no aggravating/alleviating factors. No other associated symptoms.   Patient is a 29 y.o. male presenting with musculoskeletal pain. The history is provided by the patient. No language interpreter was used.  Muscle Pain This is a new problem. The current episode started today. The problem occurs constantly. The problem has been unchanged. Associated symptoms include myalgias. Pertinent negatives include no abdominal pain, arthralgias, chest pain, chills, fatigue, fever, nausea, neck pain, vomiting or weakness. Nothing aggravates the symptoms. He has tried nothing for the symptoms. The treatment provided no relief.    Past Medical History  Diagnosis Date  . VSD (ventricular septal defect)     told closed  . Seasonal allergies    Past Surgical History  Procedure Laterality Date  . Tonsillectomy     Family History  Problem Relation Age of Onset  . Stroke Maternal Uncle 40  . Prostate cancer Paternal Grandfather 81  . Coronary artery disease Maternal Grandmother     CABG; +smoker  . Hypertension      some in family   History  Substance Use Topics  . Smoking status: Never Smoker   . Smokeless tobacco: Never Used  . Alcohol Use: Yes     Comment: Occasional    Review of Systems  Constitutional: Negative for fever, chills and  fatigue.  HENT: Negative for trouble swallowing.   Eyes: Negative for visual disturbance.  Respiratory: Negative for shortness of breath.   Cardiovascular: Negative for chest pain and palpitations.  Gastrointestinal: Negative for nausea, vomiting, abdominal pain and diarrhea.  Genitourinary: Negative for dysuria and difficulty urinating.       Dark urine  Musculoskeletal: Positive for myalgias. Negative for arthralgias and neck pain.  Skin: Negative for color change.  Neurological: Negative for dizziness and weakness.  Psychiatric/Behavioral: Negative for dysphoric mood.      Allergies  Flonase and Penicillins  Home Medications   Prior to Admission medications   Medication Sig Start Date End Date Taking? Authorizing Provider  diphenhydrAMINE (BENADRYL) 25 MG tablet Take 25 mg by mouth every 6 (six) hours as needed for itching.    Historical Provider, MD  fexofenadine (ALLEGRA) 180 MG tablet Take 180 mg by mouth daily.      Historical Provider, MD  guaiFENesin (MUCINEX) 600 MG 12 hr tablet Take 1,200 mg by mouth 2 (two) times daily.    Historical Provider, MD  montelukast (SINGULAIR) 10 MG tablet Take one tablet at bedtime 05/02/14   Eustaquio Boyden, MD   BP 124/52 mmHg  Pulse 78  Temp(Src) 98.3 F (36.8 C) (Oral)  Resp 22  Ht  (1.727 m)  Wt 154 lb (69.854 kg)  BMI 23.42 kg/m2  SpO2 98% Physical Exam  Constitutional: He is oriented to person, place, and time. He appears well-developed and well-nourished. No distress.  HENT:  Head: Normocephalic and atraumatic.  Eyes: Conjunctivae  and EOM are normal.  Neck: Normal range of motion.  Cardiovascular: Normal rate and regular rhythm.  Exam reveals no gallop and no friction rub.   No murmur heard. Pulmonary/Chest: Effort normal and breath sounds normal. He has no wheezes. He has no rales. He exhibits no tenderness.  Abdominal: Soft. He exhibits no distension. There is no tenderness. There is no rebound.  Musculoskeletal:  Normal range of motion.  Neurological: He is alert and oriented to person, place, and time. Coordination normal.  Speech is goal-oriented. Moves limbs without ataxia.   Skin: Skin is warm and dry.  Psychiatric: He has a normal mood and affect. His behavior is normal.  Nursing note and vitals reviewed.   ED Course  Procedures (including critical care time) Labs Review Labs Reviewed  CK - Abnormal; Notable for the following:    Total CK >50000 (*)    All other components within normal limits  BASIC METABOLIC PANEL - Abnormal; Notable for the following:    Glucose, Bld 100 (*)    GFR calc non Af Amer 85 (*)    All other components within normal limits  URINALYSIS, ROUTINE W REFLEX MICROSCOPIC - Abnormal; Notable for the following:    Color, Urine AMBER (*)    Hgb urine dipstick LARGE (*)    Protein, ur 30 (*)    All other components within normal limits  HEPATIC FUNCTION PANEL - Abnormal; Notable for the following:    Total Protein 5.4 (*)    AST 1093 (*)    ALT 252 (*)    Alkaline Phosphatase 38 (*)    Total Bilirubin 1.6 (*)    Indirect Bilirubin 1.3 (*)    All other components within normal limits  CBC - Abnormal; Notable for the following:    Platelets 145 (*)    All other components within normal limits  COMPREHENSIVE METABOLIC PANEL - Abnormal; Notable for the following:    Total Protein 5.4 (*)    Albumin 3.3 (*)    AST 1044 (*)    ALT 276 (*)    Alkaline Phosphatase 38 (*)    Total Bilirubin 1.9 (*)    All other components within normal limits  CK TOTAL AND CKMB - Abnormal; Notable for the following:    Total CK >50000 (*)    CK, MB 47.0 (*)    All other components within normal limits  LIPID PANEL - Abnormal; Notable for the following:    HDL 37 (*)    All other components within normal limits  CK TOTAL AND CKMB - Abnormal; Notable for the following:    Total CK 34749 (*)    CK, MB 24.7 (*)    All other components within normal limits  COMPREHENSIVE  METABOLIC PANEL - Abnormal; Notable for the following:    Total Protein 5.4 (*)    Albumin 3.2 (*)    AST 892 (*)    ALT 295 (*)    All other components within normal limits  CBC WITH DIFFERENTIAL  URINE MICROSCOPIC-ADD ON  CREATININE, SERUM  TSH  TROPONIN I  CBC  PROTIME-INR  APTT  HEPATITIS PANEL, ACUTE  URINE RAPID DRUG SCREEN (HOSP PERFORMED)  MYOGLOBIN, SERUM  ALDOLASE    Imaging Review No results found.   EKG Interpretation None      MDM   Final diagnoses:  Non-traumatic rhabdomyolysis   4:11 AM Labs and urinalysis pending. Vitals stable and patient afebrile. Patient will have IV hydration.   Patient's  CK elevated >50,000. Patient will be admitted. Creatinine stable without elevation although we have no baseline value for comparison.   Emilia Beck, PA-C 09/04/14 1209  Joya Gaskins, MD 09/06/14 [phone]

## 2014-09-02 NOTE — ED Notes (Signed)
Spoke with main laboratory personnel regarding CK and myoglobin levels have been pending for 2.5 hours. Lab personnel reported that they are currently on the 3rd dilution of the sample to determine CK level. Information reported to BristolKaitlyn, GeorgiaPA and CrawfordOni, MD.

## 2014-09-02 NOTE — ED Notes (Signed)
Patient here with complaint of severe muscle soreness secondary to strenuous workouts. States that he worked out [employer] morning. Shoulders and triceps have become very sore since that time. Patient assumed it was normal muscle soreness until yesterday when his urine appeared to be "coke" colored. States he drank large volume of water and urine changed to tea colored.

## 2014-09-02 NOTE — H&P (Signed)
Patient Demographics  Daniel Cooper, is a 29 y.o. male  MRN: [id number]   DOB - June 08, 1986  Admit Date - 09/02/2014  Outpatient Primary MD for the patient is Eustaquio Boyden, MD   Assessment Daniel Cooper is a pleasant 29 year old male with history of seasonal allergies/VSD in childhood who comes inafter he noticed some Coca-Cola colored urine early this morning, associated with generalized muscle weakness, and he was found to have rhabdomyolysis with CPK greater than 50,000 as well as evidence of myoglobin in the urine. The rhabdomyolysis seems to have been triggered by a vigorous body workout a few days ago. He also took some protein supplements/herbal supplements with the workout and it is not clear whether these may have contributed. Fortunately, his renal function is normal at present. He had otherwise been in good health until the workout. He reports that a family friend's child had a viral illness recently but he himself did not. Denies use of statins. Will therefore assume that this is spontaneous rhabdomyolysis resulting from vigorous muscle exercise but cautioned patient that he may need further evaluation for other muscle related conditions if there is recurrence. He will be admitted for IV fluids and electrolyte replacements as necessary. Will check LFTs/TSH/lipid panel/troponin I/EKG and consider further investigations depending on clinical progress. Plan   Rhabdomyolysis  Admit MedSurg  Check LFTs/TSH/lipid panel/troponin I/EKG  Monitor CPK/CK-MB.  IV fluids Seasonal allergies  Continue Singulair DVT Prophylaxis    Lovenox   AM Labs Ordered, also please review Full Orders  Family Communication: Admission, patients condition and plan of care including tests being ordered have been discussed with the patient and mother who indicate understanding and agree with the plan and Code Status.  Code Status   Full code  Likely DC to  home  Condition GUARDED    Time  spent in minutes : 40 minutes       With History of -  Past Medical History  Diagnosis Date  . VSD (ventricular septal defect)     told closed  . Seasonal allergies       Past Surgical History  Procedure Laterality Date  . Tonsillectomy      in for   Chief Complaint  Patient presents with  . Muscle Pain     HPI  Daniel Cooper  is a 29 y.o. male presenting to the ED after he noticed Coca-Cola colored urine associated with generalized muscle aches after a vigorous body workout 3 days ago. Each session of the workout over a 2 day period lasted at least 45 minutes. He also took some protein supplements with it. He is an otherwise active Games developer. He has worked out in the past but this has not been an issue. He just hadn't worked out in a long time. He denies nausea, vomiting or diarrhea. No shortness of breath, chest pain or cough. No fever. No dysuria.   Review of Systems    In addition to the HPI above,  No Fever-chills, No Headache, No changes with Vision or hearing, No problems swallowing food or Liquids, No Chest pain, Cough or Shortness of Breath, No Abdominal pain, No Nausea or Vommitting, Bowel movements are regular, No Blood in stool or Urine(Coca-Cola colored), No dysuria, No new skin rashes or bruises, No new joints pains-aches,  No recent weight gain or loss, No polyuria, polydypsia or polyphagia, No significant Mental Stressors.  A full 10 point Review of Systems was done, except as stated above, all other Review  of Systems were negative.   Social History History  Substance Use Topics  . Smoking status: Never Smoker   . Smokeless tobacco: Never Used  . Alcohol Use: Yes     Comment: Occasional    Family History Family History  Problem Relation Age of Onset  . Stroke Maternal Uncle 40  . Prostate cancer Paternal Grandfather 27  . Coronary artery disease Maternal Grandmother     CABG; +smoker  . Hypertension      some in family      Prior to Admission medications   Medication Sig Start Date End Date Taking? Authorizing Provider  diphenhydrAMINE (BENADRYL) 25 MG tablet Take 25 mg by mouth every 6 (six) hours as needed for itching.   Yes Historical Provider, MD  fexofenadine (ALLEGRA) 180 MG tablet Take 180 mg by mouth daily.     Yes Historical Provider, MD  montelukast (SINGULAIR) 10 MG tablet Take one tablet at bedtime 05/02/14  Yes Eustaquio Boyden, MD    Allergies  Allergen Reactions  . Flonase [Fluticasone Propionate] Other (See Comments)    Worsened allergy symptoms  . Penicillins     REACTION: hives    Physical Exam  Vitals  Blood pressure 115/56, pulse 69, temperature 98.3 F (36.8 C), temperature source Oral, resp. rate 16, height  (1.727 m), weight 69.854 kg (154 lb), SpO2 100 %.   1. General  lying in bed in NAD,   2. Normal affect and insight, Not Suicidal or Homicidal, Awake Alert, Oriented X 3.  3. No F.N deficits, ALL C.Nerves Intact, Strength 5/5 all 4 extremities, Sensation intact all 4 extremities, Plantars down going.  4. Ears and Eyes appear Normal, Conjunctivae clear, PERRLA. Moist Oral Mucosa.  5. Supple Neck, No JVD, No cervical lymphadenopathy appriciated, No Carotid Bruits.  6. Symmetrical Chest wall movement, Good air movement bilaterally, CTAB.  7. RRR, No Gallops, Rubs or Murmurs, No Parasternal Heave.  8. Positive Bowel Sounds, Abdomen Soft, Non tender, No organomegaly appriciated,No rebound -guarding or rigidity.  9.  No Cyanosis, Normal Skin Turgor, No Skin Rash or Bruise.  10. Good muscle tone,  joints appear normal , no effusions, Normal ROM.  11. No Palpable Lymph Nodes in Neck or Axillae  Data Review  CBC  Recent Labs Lab 09/02/14 0304  WBC 6.2  HGB 15.3  HCT 42.9  PLT 163  MCV 85.5  MCH 30.5  MCHC 35.7  RDW 12.5  LYMPHSABS 1.2  MONOABS 0.5  EOSABS 0.1  BASOSABS 0.0    ------------------------------------------------------------------------------------------------------------------  Chemistries   Recent Labs Lab 09/02/14 0304  NA 138  K 4.1  CL 104  CO2 27  GLUCOSE 100*  BUN 23  CREATININE 1.16  CALCIUM 8.8   ------------------------------------------------------------------------------------------------------------------ estimated creatinine clearance is 92.5 mL/min (by C-G formula based on Cr of 1.16). ------------------------------------------------------------------------------------------------------------------ No results for input(s): TSH, T4TOTAL, T3FREE, THYROIDAB in the last 72 hours.  Invalid input(s): FREET3   Coagulation profile No results for input(s): INR, PROTIME in the last 168 hours. ------------------------------------------------------------------------------------------------------------------- No results for input(s): DDIMER in the last 72 hours. -------------------------------------------------------------------------------------------------------------------  Cardiac Enzymes No results for input(s): CKMB, TROPONINI, MYOGLOBIN in the last 168 hours.  Invalid input(s): CK ------------------------------------------------------------------------------------------------------------------ Invalid input(s): POCBNP   ---------------------------------------------------------------------------------------------------------------  Urinalysis    Component Value Date/Time   COLORURINE AMBER* 09/02/2014 0422   APPEARANCEUR CLEAR 09/02/2014 0422   LABSPEC 1.022 09/02/2014 0422   PHURINE 6.5 09/02/2014 0422   GLUCOSEU NEGATIVE 09/02/2014 0422   HGBUR LARGE* 09/02/2014 0422  BILIRUBINUR NEGATIVE 09/02/2014 0422   KETONESUR NEGATIVE 09/02/2014 0422   PROTEINUR 30* 09/02/2014 0422   UROBILINOGEN 1.0 09/02/2014 0422   NITRITE NEGATIVE 09/02/2014 0422   LEUKOCYTESUR NEGATIVE 09/02/2014 0422     ----------------------------------------------------------------------------------------------------------------  Imaging results:   No results found.      Otilia Kareem M.D on 09/02/2014 at 8:04 AM  Between 7am to 7pm - Pager - [phone]  After 7pm go to [email or web address] - password [email or web address]  And look for the night coverage person covering me after hours  [facility] Office  [phone]

## 2014-09-03 DIAGNOSIS — R740 Nonspecific elevation of levels of transaminase and lactic acid dehydrogenase [LDH]: Secondary | ICD-10-CM

## 2014-09-03 DIAGNOSIS — R7401 Elevation of levels of liver transaminase levels: Secondary | ICD-10-CM | POA: Diagnosis present

## 2014-09-03 LAB — RAPID URINE DRUG SCREEN, HOSP PERFORMED
AMPHETAMINES: NOT DETECTED
Barbiturates: NOT DETECTED
Benzodiazepines: NOT DETECTED
COCAINE: NOT DETECTED
Opiates: NOT DETECTED
Tetrahydrocannabinol: NOT DETECTED

## 2014-09-03 LAB — COMPREHENSIVE METABOLIC PANEL
ALK PHOS: 38 U/L — AB (ref 39–117)
ALT: 276 U/L — ABNORMAL HIGH (ref 0–53)
AST: 1044 U/L — ABNORMAL HIGH (ref 0–37)
Albumin: 3.3 g/dL — ABNORMAL LOW (ref 3.5–5.2)
Anion gap: 6 (ref 5–15)
BUN: 6 mg/dL (ref 6–23)
CO2: 27 mmol/L (ref 19–32)
Calcium: 8.6 mg/dL (ref 8.4–10.5)
Chloride: 109 mEq/L (ref 96–112)
Creatinine, Ser: 0.9 mg/dL (ref 0.50–1.35)
GFR calc Af Amer: >90 mL/min (ref >90)
GLUCOSE: 94 mg/dL (ref 70–99)
Potassium: 4.5 mmol/L (ref 3.5–5.1)
Sodium: 142 mmol/L (ref 135–145)
Total Bilirubin: 1.9 mg/dL — ABNORMAL HIGH (ref 0.3–1.2)
Total Protein: 5.4 g/dL — ABNORMAL LOW (ref 6.0–8.3)

## 2014-09-03 LAB — CBC
HCT: 41.5 % (ref 39.0–52.0)
HEMOGLOBIN: 14.2 g/dL (ref 13.0–17.0)
MCH: 29.3 pg (ref 26.0–34.0)
MCHC: 34.2 g/dL (ref 30.0–36.0)
MCV: 85.6 fL (ref 78.0–100.0)
Platelets: 151 10*3/uL (ref 150–400)
RBC: 4.85 MIL/uL (ref 4.22–5.81)
RDW: 12.5 % (ref 11.5–15.5)
WBC: 4.6 10*3/uL (ref 4.0–10.5)

## 2014-09-03 LAB — CK TOTAL AND CKMB (NOT AT ARMC)
CK, MB: 47 ng/mL (ref 0.3–4.0)
Total CK: >50000 U/L — ABNORMAL HIGH (ref 7–232)

## 2014-09-03 LAB — PROTIME-INR
INR: 1.01 (ref 0.00–1.49)
PROTHROMBIN TIME: 13.4 s (ref 11.6–15.2)

## 2014-09-03 LAB — HEPATITIS PANEL, ACUTE
HCV Ab: NEGATIVE
HEP A IGM: NONREACTIVE
HEP B C IGM: NONREACTIVE
Hepatitis B Surface Ag: NEGATIVE

## 2014-09-03 LAB — APTT: aPTT: 29 seconds (ref 24–37)

## 2014-09-03 LAB — LIPID PANEL
Cholesterol: 114 mg/dL (ref 0–200)
HDL: 37 mg/dL — ABNORMAL LOW (ref >39)
LDL Cholesterol: 69 mg/dL (ref 0–99)
Total CHOL/HDL Ratio: 3.1 RATIO
Triglycerides: 41 mg/dL (ref <150)
VLDL: 8 mg/dL (ref 0–40)

## 2014-09-03 NOTE — Progress Notes (Signed)
CRITICAL VALUE ALERT  Critical value received: CK MB 47  Date of notification: 09/03/2014  Time of notification:  0728  Critical value read back:Yes.    Nurse who received alert:  Salvadore OxfordJessica Marque Bango  MD notified (1st page):  Dr. Venetia Constableanga Time of first page:  [phone]  MD notified (2nd page):  Time of second page:  Responding MD:  Dr. Venetia Constableanga  Time MD responded:  0740  MD ordered to continue IV fluids. Normal saline at 150 cc's/hr.

## 2014-09-03 NOTE — Progress Notes (Signed)
[facility] PROGRESS NOTE  Daniel Cooper WUJ:[id number] DOB: [birth year] DOA: 09/02/2014 PCP: Daniel Boyden, MD  Assessment Daniel Cooper is a pleasant 29 year old male with history of seasonal allergies/VSD in childhood who came in after he noticed some Coca-Cola colored urine associated with generalized muscle weakness after a vigorous body work out, and he was found to have rhabdomyolysis with CPK greater than 50,000. The thinking is that the muscle work out precipitated this episode, which is the first. He reports feeling better on IVF. LFTs are elevated but this is likely due to rhabdomyolysis. In any case, will check Hepatitis panel and drug urine screen. CPK remains greater than 50,000 but patient reports less muscle pains. Renal function remains normal. Will continue hydration. Will not pursue further studies at other than basic screening tests at this point as this is the first such event with an apparent precipitating factor. Plan  Rhabdomyolysis/Transaminitis  Hepatitis panel/Utox  Monitor CPK/CK-MB/LFTs.  IV fluids Seasonal allergies  Seem under control. Hold singular in view of rhabdo. DVT Prophylaxis   Lovenox  Family Communication: Discussed with both parents at bedside..  Code Status   Full code  Likely DC to home Objective: Filed Vitals:   09/03/14 0622  BP: 117/73  Pulse: 55  Temp: 97.7 F (36.5 C)  Resp: 16    Intake/Output Summary (Last 24 hours) at 09/03/14 0935 Last data filed at 09/03/14 0729  Gross per 24 hour  Intake   1405 ml  Output   3950 ml  Net  -2545 ml   Filed Weights   09/02/14 0251 09/02/14 0859  Weight: 69.854 kg (154 lb) 69.854 kg (154 lb)    Exam:   General:  Sitting up, not in distress.  Cardiovascular: RRR. S1S2 normal. No murmurs.  Respiratory: Lungs clear.  Abdomen: soft and non tender. Bowel sounds normal.  Musculoskeletal: No pedal edema.   Data Reviewed: Basic Metabolic Panel:  Recent  Labs Lab 09/02/14 0304 09/02/14 0958 09/03/14 0351  NA 138  --  142  K 4.1  --  4.5  CL 104  --  109  CO2 27  --  27  GLUCOSE 100*  --  94  BUN 23  --  6  CREATININE 1.16 0.83 0.90  CALCIUM 8.8  --  8.6   Liver Function Tests:  Recent Labs Lab 09/02/14 0958 09/03/14 0351  AST 1093* 1044*  ALT 252* 276*  ALKPHOS 38* 38*  BILITOT 1.6* 1.9*  PROT 5.4* 5.4*  ALBUMIN 3.6 3.3*   No results for input(s): LIPASE, AMYLASE in the last 168 hours. No results for input(s): AMMONIA in the last 168 hours. CBC:  Recent Labs Lab 09/02/14 0304 09/02/14 0958 09/03/14 0351  WBC 6.2 4.6 4.6  NEUTROABS 4.5  --   --   HGB 15.3 14.2 14.2  HCT 42.9 41.4 41.5  MCV 85.5 85.0 85.6  PLT 163 145* 151   Cardiac Enzymes:  Recent Labs Lab 09/02/14 0304 09/02/14 0958 09/03/14 0351  CKTOTAL >50000*  --  >50000*  CKMB  --   --  47.0*  TROPONINI  --  <0.03  --    BNP (last 3 results) No results for input(s): PROBNP in the last 8760 hours. CBG: No results for input(s): GLUCAP in the last 168 hours.  No results found for this or any previous visit (from the past 240 hour(s)).   Studies: No results found.  Scheduled Meds: . enoxaparin (LOVENOX) injection  40 mg Subcutaneous Q24H  Continuous Infusions: . sodium chloride 175 mL/hr at 09/03/14 [id number]    Active Problems:   Rhabdomyolysis   Seasonal allergies    Time spent: 20 minutes.    Daniel Cooper  [facility] Pager [phone]. If 7PM-7AM, please contact night-coverage at [email or web address], password [email or web address] 09/03/2014, 9:35 AM  LOS: 1 day

## 2014-09-04 LAB — CK TOTAL AND CKMB (NOT AT ARMC)
CK, MB: 24.7 ng/mL (ref 0.3–4.0)
Relative Index: 0.1 (ref 0.0–2.5)
Total CK: 34749 U/L — ABNORMAL HIGH (ref 7–232)

## 2014-09-04 LAB — COMPREHENSIVE METABOLIC PANEL
ALT: 295 U/L — ABNORMAL HIGH (ref 0–53)
AST: 892 U/L — AB (ref 0–37)
Albumin: 3.2 g/dL — ABNORMAL LOW (ref 3.5–5.2)
Alkaline Phosphatase: 41 U/L (ref 39–117)
Anion gap: 8 (ref 5–15)
BUN: 6 mg/dL (ref 6–23)
CHLORIDE: 107 meq/L (ref 96–112)
CO2: 26 mmol/L (ref 19–32)
Calcium: 8.6 mg/dL (ref 8.4–10.5)
Creatinine, Ser: 0.86 mg/dL (ref 0.50–1.35)
GFR calc Af Amer: >90 mL/min (ref >90)
Glucose, Bld: 94 mg/dL (ref 70–99)
Potassium: 4.2 mmol/L (ref 3.5–5.1)
Sodium: 141 mmol/L (ref 135–145)
TOTAL PROTEIN: 5.4 g/dL — AB (ref 6.0–8.3)
Total Bilirubin: 1 mg/dL (ref 0.3–1.2)

## 2014-09-04 NOTE — Progress Notes (Addendum)
[facility] PROGRESS NOTE  Daniel Cooper ZOX:[id number] DOB: 06-09-1986 DOA: 09/02/2014 PCP: Eustaquio Boyden, MD  Assessment Daniel Cooper is a pleasant 29 year old male with history of seasonal allergies/VSD in childhood who came in after he noticed some Coca-Cola colored urine associated with generalized muscle weakness after a vigorous body work out, and he was found to have rhabdomyolysis with CPK greater than 50,000. The thinking is that the muscle work out precipitated this episode, which is the first. He reports feeling better on IVF. LFTs are elevated but this is likely due to rhabdomyolysis. In any case, Hepatitis panel and drug urine screen negative. CPK now 34,000 and AST has improved to 900. Will check Aldolase level for completeness sake. If patient continues to do well, will likely DC home tomorrow with instructions to improve oral fluid intake in the short-term. Plan  Rhabdomyolysis/Transaminitis  Hepatitis panel/Utox normal.  Monitor CPK/CK-MB/LFTs.  IV fluids Seasonal allergies  Seem under control. Hold singular in view of rhabdo. DVT Prophylaxis   Lovenox  Family Communication: Discussed with both parents at bedside..  Code Status   Full code  Likely DC to home in am Objective: Filed Vitals:   09/04/14 0551  BP: 127/57  Pulse: 52  Temp: 98.1 F (36.7 C)  Resp: 16    Intake/Output Summary (Last 24 hours) at 09/04/14 0717 Last data filed at 09/03/14 2322  Gross per 24 hour  Intake 5199.58 ml  Output   2100 ml  Net 3099.58 ml   Filed Weights   09/02/14 0251 09/02/14 0859  Weight: 69.854 kg (154 lb) 69.854 kg (154 lb)    Exam:   General:  No distress.  Cardiovascular: S1S2 heard. No murmurs. RRR.  Respiratory: Lungs clear.  Abdomen: Soft, non tender.+BS  Musculoskeletal: No pedal edema.  Data Reviewed: Basic Metabolic Panel:  Recent Labs Lab 09/02/14 0304 09/02/14 0958 09/03/14 0351 09/04/14 0430  NA 138  --  142  141  K 4.1  --  4.5 4.2  CL 104  --  109 107  CO2 27  --  27 26  GLUCOSE 100*  --  94 94  BUN 23  --  6 6  CREATININE 1.16 0.83 0.90 0.86  CALCIUM 8.8  --  8.6 8.6   Liver Function Tests:  Recent Labs Lab 09/02/14 0958 09/03/14 0351 09/04/14 0430  AST 1093* 1044* 892*  ALT 252* 276* 295*  ALKPHOS 38* 38* 41  BILITOT 1.6* 1.9* 1.0  PROT 5.4* 5.4* 5.4*  ALBUMIN 3.6 3.3* 3.2*   No results for input(s): LIPASE, AMYLASE in the last 168 hours. No results for input(s): AMMONIA in the last 168 hours. CBC:  Recent Labs Lab 09/02/14 0304 09/02/14 0958 09/03/14 0351  WBC 6.2 4.6 4.6  NEUTROABS 4.5  --   --   HGB 15.3 14.2 14.2  HCT 42.9 41.4 41.5  MCV 85.5 85.0 85.6  PLT 163 145* 151   Cardiac Enzymes:  Recent Labs Lab 09/02/14 0304 09/02/14 0958 09/03/14 0351 09/04/14 0430  CKTOTAL >50000*  --  >50000* 34749*  CKMB  --   --  47.0* 24.7*  TROPONINI  --  <0.03  --   --    BNP (last 3 results) No results for input(s): PROBNP in the last 8760 hours. CBG: No results for input(s): GLUCAP in the last 168 hours.  No results found for this or any previous visit (from the past 240 hour(s)).   Studies: No results found.  Scheduled Meds: . enoxaparin (  LOVENOX) injection  40 mg Subcutaneous Q24H   Continuous Infusions: . sodium chloride 175 mL/hr at 09/04/14 0422    Active Problems:   Rhabdomyolysis   Seasonal allergies   Transaminitis    Time spent: 15 minutes.    Daniel Cooper  [facility] Pager [phone]. If 7PM-7AM, please contact night-coverage at [email or web address], password [email or web address] 09/04/2014, 7:17 AM  LOS: 2 days

## 2014-09-05 ENCOUNTER — Telehealth: Payer: Self-pay | Admitting: Family Medicine

## 2014-09-05 LAB — COMPREHENSIVE METABOLIC PANEL
ALBUMIN: 3.3 g/dL — AB (ref 3.5–5.2)
ALK PHOS: 46 U/L (ref 39–117)
ALT: 293 U/L — ABNORMAL HIGH (ref 0–53)
ANION GAP: 9 (ref 5–15)
AST: 678 U/L — AB (ref 0–37)
BUN: 5 mg/dL — ABNORMAL LOW (ref 6–23)
CO2: 23 mmol/L (ref 19–32)
CREATININE: 0.84 mg/dL (ref 0.50–1.35)
Calcium: 8.8 mg/dL (ref 8.4–10.5)
Chloride: 108 mEq/L (ref 96–112)
GFR calc Af Amer: >90 mL/min (ref >90)
GFR calc non Af Amer: >90 mL/min (ref >90)
GLUCOSE: 93 mg/dL (ref 70–99)
Potassium: 4.1 mmol/L (ref 3.5–5.1)
Sodium: 140 mmol/L (ref 135–145)
TOTAL PROTEIN: 5.9 g/dL — AB (ref 6.0–8.3)
Total Bilirubin: 1.1 mg/dL (ref 0.3–1.2)

## 2014-09-05 LAB — CK TOTAL AND CKMB (NOT AT ARMC)
CK, MB: 13.5 ng/mL (ref 0.3–4.0)
RELATIVE INDEX: 0.1 (ref 0.0–2.5)
Total CK: 19816 U/L — ABNORMAL HIGH (ref 7–232)

## 2014-09-05 LAB — MYOGLOBIN, SERUM: Myoglobin: 7060 mcg/L — ABNORMAL HIGH (ref <=50)

## 2014-09-05 NOTE — Telephone Encounter (Signed)
Pt hospitalized for rhabdo after intense workout from 1/16-19/2016. plz call for hosp f/u visit, ensure staying well hydrated and schedule f/u appt w/in 2 wks.

## 2014-09-05 NOTE — Discharge Summary (Signed)
Daniel Cooper, is a 29 y.o. male  DOB 05-21-1986  MRN [id number].  Admission date:  09/02/2014  Admitting Physician  Conley CanalSimbiso Terika Pillard, MD  Discharge Date:  09/05/2014   Primary MD  Eustaquio BoydenJavier Gutierrez, MD  Recommendations for primary care physician for things to follow:   Please follow CPK/LFTs/Aldolase. Thanks.   Admission Diagnosis  Non-traumatic rhabdomyolysis [M62.82]   Discharge Diagnosis  Non-traumatic rhabdomyolysis [M62.82]    Active Problems:   Seasonal allergies      Past Medical History  Diagnosis Date  . VSD (ventricular septal defect)     told closed  . Seasonal allergies   . History of rhabdomyolysis 08/2014    after intense workout    Past Surgical History  Procedure Laterality Date  . Tonsillectomy         History of present illness and  Hospital Course:     Kindly see H&P for history of present illness and admission details, please review complete Labs, Consult reports and Test reports for all details in brief  HPI  from the history and physical done on the day of admission    Hospital Course  Daniel Cooper is a pleasant 29 year old male with history of seasonal allergies/VSD in childhood who came in after he noticed some Coca-[employer] colored urine associated with generalized muscle weakness after a vigorous body work out, and he was found to have rhabdomyolysis with CPK greater than 50,000. The thinking is that the muscle work out precipitated this episode, which is the first. He imp[roved on IVF. LFTs were elevated but this is likely due to rhabdomyolysis. In any case, Hepatitis panel and drug urine screen negative. CPK is now 19,000 and AST has improved to 600. Aldolase level requested for completeness sake is pending at d/c. Patient is stable for d/c home to increase oral fluid intake in the  interim, and follow with his PCP later this week to ensure complete resolution of rhabdomyolysis. If recurrence, would worry for some muscle conditions.   Discharge Condition: Stable.   Follow UP With PCP later this week.     Discharge Instructions  and  Discharge Medications    Discharge Instructions    Diet general    Complete by:  As directed      Increase activity slowly    Complete by:  As directed             Medication List    TAKE these medications        diphenhydrAMINE 25 MG tablet  Commonly known as:  BENADRYL  Take 25 mg by mouth every 6 (six) hours as needed for itching.     fexofenadine 180 MG tablet  Commonly known as:  ALLEGRA  Take 180 mg by mouth daily.     montelukast 10 MG tablet  Commonly known as:  SINGULAIR  Take one tablet at bedtime          Diet and Activity recommendation: See Discharge Instructions above   Consults obtained - None.  Major procedures and Radiology Reports - PLEASE review detailed and final reports for all details, in brief -    No results found.  Micro Results   No results found for this or any previous visit (from the past 240 hour(s)).     Today   Subjective:   Daniel Cooper today has no headache,no chest abdominal pain,no new weakness tingling or numbness, feels much better wants to go home today.   Objective:   Blood pressure 119/69, pulse 50, temperature 98.2 F (36.8 C), temperature source Oral, resp. rate 16, height  (1.727 m), weight 69.854 kg (154 lb), SpO2 99 %.   Intake/Output Summary (Last 24 hours) at 09/05/14 1020 Last data filed at 09/05/14 0600  Gross per 24 hour  Intake 2731.67 ml  Output      0 ml  Net 2731.67 ml    Exam Awake Alert, Oriented x 3, No new F.N deficits, Normal affect [place].AT,PERRAL Supple Neck,No JVD, No cervical lymphadenopathy appriciated.  Symmetrical Chest wall movement, Good air movement bilaterally, CTAB RRR,No Gallops,Rubs or new Murmurs, No  Parasternal Heave +ve B.Sounds, Abd Soft, Non tender, No organomegaly appriciated, No rebound -guarding or rigidity. No Cyanosis, Clubbing or edema, No new Rash or bruise  Data Review   CBC w Diff: Lab Results  Component Value Date   WBC 4.6 09/03/2014   HGB 14.2 09/03/2014   HCT 41.5 09/03/2014   PLT 151 09/03/2014   LYMPHOPCT 19 09/02/2014   MONOPCT 7 09/02/2014   EOSPCT 1 09/02/2014   BASOPCT 0 09/02/2014    CMP: Lab Results  Component Value Date   NA 140 09/05/2014   K 4.1 09/05/2014   CL 108 09/05/2014   CO2 23 09/05/2014   BUN 5* 09/05/2014   CREATININE 0.84 09/05/2014   PROT 5.9* 09/05/2014   ALBUMIN 3.3* 09/05/2014   BILITOT 1.1 09/05/2014   ALKPHOS 46 09/05/2014   AST 678* 09/05/2014   ALT 293* 09/05/2014  .   Total Time in preparing paper work, data evaluation and todays exam - 35 minutes  Aolanis Crispen M.D on 09/05/2014 at 10:20 AM  [facility] Office  [phone]

## 2014-09-06 LAB — ALDOLASE: Aldolase: 333 U/L — ABNORMAL HIGH (ref <=8.1)

## 2014-09-06 NOTE — Telephone Encounter (Signed)
Spoke with patient. He is feeling better and drinking plenty of water. Follow up has already been scheduled for tomorrow.

## 2014-09-07 ENCOUNTER — Ambulatory Visit (INDEPENDENT_AMBULATORY_CARE_PROVIDER_SITE_OTHER): Payer: 59 | Admitting: Family Medicine

## 2014-09-07 ENCOUNTER — Encounter: Payer: Self-pay | Admitting: Family Medicine

## 2014-09-07 VITALS — BP 130/76 | HR 80 | Temp 98.1°F | Wt 158.0 lb

## 2014-09-07 DIAGNOSIS — M6282 Rhabdomyolysis: Secondary | ICD-10-CM

## 2014-09-07 LAB — CK: Total CK: 5713 U/L — ABNORMAL HIGH (ref 7–232)

## 2014-09-07 LAB — HEPATIC FUNCTION PANEL
ALT: 377 U/L — ABNORMAL HIGH (ref 0–53)
AST: 352 U/L — ABNORMAL HIGH (ref 0–37)
Albumin: 4.7 g/dL (ref 3.5–5.2)
Alkaline Phosphatase: 61 U/L (ref 39–117)
Bilirubin, Direct: 0.3 mg/dL (ref 0.0–0.3)
Total Bilirubin: 1.5 mg/dL — ABNORMAL HIGH (ref 0.2–1.2)
Total Protein: 7.3 g/dL (ref 6.0–8.3)

## 2014-09-07 NOTE — Progress Notes (Signed)
BP 130/76 mmHg  Pulse 80  Temp(Src) 98.1 F (36.7 C) (Oral)  Wt 158 lb (71.668 kg)   CC: hosp f/u visit  Subjective:    Patient ID: Daniel Cooper, male    DOB: [birth year], 29 y.o.   MRN: [id number]  HPI: Daniel Cooper is a 29 y.o. male presenting on 09/07/2014 for Follow-up   Recent hospitalization - records reviewed. Severe rhabdomyolysis from intense workout (new gym with personal trainer, 2 days in a row - 15 min weight lifting and 15 min cardio, may not have hydrated well after this). Started with shoulder pain and tricep tightness. Then next day found cola colored urine. Found to have transaminitis but no renal insufficiency. Acute hepatitis panel was normal. UDS was normal.  No fmhx or personal hx muscle disorders, no prior h/o muscle weakness.   Since discharge, no vomiting, nausea, fevers/chills, normal bowels. No abd pain, chest pain, muscle pain.  Had been taking protein powder and BCA for muscle recovery.   Lab Results  Component Value Date   TSH 0.780 09/02/2014    Admission date: 09/02/2014  Discharge Date: 09/05/2014  F/u phone call: 09/06/2014 Primary MD Eustaquio Boyden, MD Recommendations for primary care physician for things to follow:  Please follow CPK/LFTs/Aldolase. Thanks. Admission Diagnosis Non-traumatic rhabdomyolysis [M62.82] Discharge Diagnosis Non-traumatic rhabdomyolysis [M62.82]   Relevant past medical, surgical, family and social history reviewed and updated as indicated. Interim medical history since our last visit reviewed. Allergies and medications reviewed and updated. Current Outpatient Prescriptions on File Prior to Visit  Medication Sig  . diphenhydrAMINE (BENADRYL) 25 MG tablet Take 25 mg by mouth every 6 (six) hours as needed for itching.  . fexofenadine (ALLEGRA) 180 MG tablet Take 180 mg by mouth daily.    . montelukast (SINGULAIR) 10 MG tablet Take one tablet at bedtime (Patient not taking: Reported on  09/07/2014)   No current facility-administered medications on file prior to visit.    Review of Systems Per HPI unless specifically indicated above     Objective:    BP 130/76 mmHg  Pulse 80  Temp(Src) 98.1 F (36.7 C) (Oral)  Wt 158 lb (71.668 kg)  Wt Readings from Last 3 Encounters:  09/07/14 158 lb (71.668 kg)  05/02/14 161 lb 1.9 oz (73.084 kg)  01/06/13 154 lb 8 oz (70.081 kg)    Physical Exam  Constitutional: He appears well-developed and well-nourished. No distress.  HENT:  Mouth/Throat: Oropharynx is clear and moist. No oropharyngeal exudate.  Neck: No thyromegaly present.  Cardiovascular: Normal rate, regular rhythm, normal heart sounds and intact distal pulses.   No murmur heard. Pulmonary/Chest: Effort normal and breath sounds normal. No respiratory distress. He has no wheezes. He has no rales.  Abdominal: Soft. Normal appearance and bowel sounds are normal. He exhibits no distension and no mass. There is no hepatosplenomegaly. There is no tenderness. There is no rigidity, no rebound, no guarding, no CVA tenderness and negative Murphy's sign.  Musculoskeletal: He exhibits no edema.  Neurological:  Strength grossly intact  Skin: Skin is warm and dry. No rash noted.  Psychiatric: He has a normal mood and affect.  Nursing note and vitals reviewed.  Results for orders placed or performed during the hospital encounter of 09/02/14  CK  Result Value Ref Range   Total CK >50000 (H) 7 - 232 U/L  Myoglobin, serum  Result Value Ref Range   Myoglobin 7060 (H) <=50 mcg/L  CBC with Differential  Result Value  Ref Range   WBC 6.2 4.0 - 10.5 K/uL   RBC 5.02 4.22 - 5.81 MIL/uL   Hemoglobin 15.3 13.0 - 17.0 g/dL   HCT 16.1 09.6 - 04.5 %   MCV 85.5 78.0 - 100.0 fL   MCH 30.5 26.0 - 34.0 pg   MCHC 35.7 30.0 - 36.0 g/dL   RDW 40.9 81.1 - 91.4 %   Platelets 163 150 - 400 K/uL   Neutrophils Relative % 73 43 - 77 %   Neutro Abs 4.5 1.7 - 7.7 K/uL   Lymphocytes Relative 19 12  - 46 %   Lymphs Abs 1.2 0.7 - 4.0 K/uL   Monocytes Relative 7 3 - 12 %   Monocytes Absolute 0.5 0.1 - 1.0 K/uL   Eosinophils Relative 1 0 - 5 %   Eosinophils Absolute 0.1 0.0 - 0.7 K/uL   Basophils Relative 0 0 - 1 %   Basophils Absolute 0.0 0.0 - 0.1 K/uL  Basic metabolic panel  Result Value Ref Range   Sodium 138 135 - 145 mmol/L   Potassium 4.1 3.5 - 5.1 mmol/L   Chloride 104 96 - 112 mEq/L   CO2 27 19 - 32 mmol/L   Glucose, Bld 100 (H) 70 - 99 mg/dL   BUN 23 6 - 23 mg/dL   Creatinine, Ser 7.82 0.50 - 1.35 mg/dL   Calcium 8.8 8.4 - 95.6 mg/dL   GFR calc non Af Amer 85 (L) >90 mL/min   GFR calc Af Amer >90 >90 mL/min   Anion gap 7 5 - 15  Urinalysis, Routine w reflex microscopic  Result Value Ref Range   Color, Urine AMBER (A) YELLOW   APPearance CLEAR CLEAR   Specific Gravity, Urine 1.022 1.005 - 1.030   pH 6.5 5.0 - 8.0   Glucose, UA NEGATIVE NEGATIVE mg/dL   Hgb urine dipstick LARGE (A) NEGATIVE   Bilirubin Urine NEGATIVE NEGATIVE   Ketones, ur NEGATIVE NEGATIVE mg/dL   Protein, ur 30 (A) NEGATIVE mg/dL   Urobilinogen, UA 1.0 0.0 - 1.0 mg/dL   Nitrite NEGATIVE NEGATIVE   Leukocytes, UA NEGATIVE NEGATIVE  Urine microscopic-add on  Result Value Ref Range   Squamous Epithelial / LPF RARE RARE   WBC, UA 0-2 <3 WBC/hpf   RBC / HPF 0-2 <3 RBC/hpf   Bacteria, UA RARE RARE  Hepatic function panel  Result Value Ref Range   Total Protein 5.4 (L) 6.0 - 8.3 g/dL   Albumin 3.6 3.5 - 5.2 g/dL   AST 2130 (H) 0 - 37 U/L   ALT 252 (H) 0 - 53 U/L   Alkaline Phosphatase 38 (L) 39 - 117 U/L   Total Bilirubin 1.6 (H) 0.3 - 1.2 mg/dL   Bilirubin, Direct 0.3 0.0 - 0.3 mg/dL   Indirect Bilirubin 1.3 (H) 0.3 - 0.9 mg/dL  CBC  Result Value Ref Range   WBC 4.6 4.0 - 10.5 K/uL   RBC 4.87 4.22 - 5.81 MIL/uL   Hemoglobin 14.2 13.0 - 17.0 g/dL   HCT 86.5 78.4 - 69.6 %   MCV 85.0 78.0 - 100.0 fL   MCH 29.2 26.0 - 34.0 pg   MCHC 34.3 30.0 - 36.0 g/dL   RDW 29.5 28.4 - 13.2 %    Platelets 145 (L) 150 - 400 K/uL  Creatinine, serum  Result Value Ref Range   Creatinine, Ser 0.83 0.50 - 1.35 mg/dL   GFR calc non Af Amer >90 >90 mL/min   GFR calc Af  Amer >90 >90 mL/min  TSH  Result Value Ref Range   TSH 0.780 0.350 - 4.500 uIU/mL  Troponin I  Result Value Ref Range   Troponin I <0.03 <0.031 ng/mL  Comprehensive metabolic panel  Result Value Ref Range   Sodium 142 135 - 145 mmol/L   Potassium 4.5 3.5 - 5.1 mmol/L   Chloride 109 96 - 112 mEq/L   CO2 27 19 - 32 mmol/L   Glucose, Bld 94 70 - 99 mg/dL   BUN 6 6 - 23 mg/dL   Creatinine, Ser 1.61 0.50 - 1.35 mg/dL   Calcium 8.6 8.4 - 09.6 mg/dL   Total Protein 5.4 (L) 6.0 - 8.3 g/dL   Albumin 3.3 (L) 3.5 - 5.2 g/dL   AST 0454 (H) 0 - 37 U/L   ALT 276 (H) 0 - 53 U/L   Alkaline Phosphatase 38 (L) 39 - 117 U/L   Total Bilirubin 1.9 (H) 0.3 - 1.2 mg/dL   GFR calc non Af Amer >90 >90 mL/min   GFR calc Af Amer >90 >90 mL/min   Anion gap 6 5 - 15  CBC  Result Value Ref Range   WBC 4.6 4.0 - 10.5 K/uL   RBC 4.85 4.22 - 5.81 MIL/uL   Hemoglobin 14.2 13.0 - 17.0 g/dL   HCT 09.8 11.9 - 14.7 %   MCV 85.6 78.0 - 100.0 fL   MCH 29.3 26.0 - 34.0 pg   MCHC 34.2 30.0 - 36.0 g/dL   RDW 82.9 56.2 - 13.0 %   Platelets 151 150 - 400 K/uL  Protime-INR  Result Value Ref Range   Prothrombin Time 13.4 11.6 - 15.2 seconds   INR 1.01 0.00 - 1.49  APTT  Result Value Ref Range   aPTT 29 24 - 37 seconds   CK total and CKMB (cardiac)  Result Value Ref Range   Total CK >50000 (H) 7 - 232 U/L   CK, MB 47.0 (HH) 0.3 - 4.0 ng/mL   Relative Index NOT CALCULATED 0.0 - 2.5  Lipid panel  Result Value Ref Range   Cholesterol 114 0 - 200 mg/dL   Triglycerides 41 <865 mg/dL   HDL 37 (L) >78 mg/dL   Total CHOL/HDL Ratio 3.1 RATIO   VLDL 8 0 - 40 mg/dL   LDL Cholesterol 69 0 - 99 mg/dL  Hepatitis panel, acute  Result Value Ref Range   Hepatitis B Surface Ag NEGATIVE NEGATIVE   HCV Ab NEGATIVE NEGATIVE   Hep A IgM NON REACTIVE NON  REACTIVE   Hep B C IgM NON REACTIVE NON REACTIVE  Urine rapid drug screen (hosp performed)  Result Value Ref Range   Opiates NONE DETECTED NONE DETECTED   Cocaine NONE DETECTED NONE DETECTED   Benzodiazepines NONE DETECTED NONE DETECTED   Amphetamines NONE DETECTED NONE DETECTED   Tetrahydrocannabinol NONE DETECTED NONE DETECTED   Barbiturates NONE DETECTED NONE DETECTED   CK total and CKMB (cardiac)  Result Value Ref Range   [place] 34749 (H) 7 - 232 U/L   CK, MB 24.7 (HH) 0.3 - 4.0 ng/mL   Relative Index 0.1 0.0 - 2.5  Comprehensive metabolic panel  Result Value Ref Range   Sodium 141 135 - 145 mmol/L   Potassium 4.2 3.5 - 5.1 mmol/L   Chloride 107 96 - 112 mEq/L   CO2 26 19 - 32 mmol/L   Glucose, Bld 94 70 - 99 mg/dL   BUN 6 6 - 23 mg/dL  Creatinine, Ser 0.86 0.50 - 1.35 mg/dL   Calcium 8.6 8.4 - 40.910.5 mg/dL   Total Protein 5.4 (L) 6.0 - 8.3 g/dL   Albumin 3.2 (L) 3.5 - 5.2 g/dL   AST 811892 (H) 0 - 37 U/L   ALT 295 (H) 0 - 53 U/L   Alkaline Phosphatase 41 39 - 117 U/L   Total Bilirubin 1.0 0.3 - 1.2 mg/dL   GFR calc non Af Amer >90 >90 mL/min   GFR calc Af Amer >90 >90 mL/min   Anion gap 8 5 - 15  Aldolase  Result Value Ref Range   Aldolase 333.0 (H) <=8.1 U/L   CK total and CKMB (cardiac)  Result Value Ref Range   [place] 198[address] (H) 7 - 232 U/L   CK, MB 13.5 (HH) 0.3 - 4.0 ng/mL   Relative Index 0.1 0.0 - 2.5  Comprehensive metabolic panel  Result Value Ref Range   Sodium 140 135 - 145 mmol/L   Potassium 4.1 3.5 - 5.1 mmol/L   Chloride 108 96 - 112 mEq/L   CO2 23 19 - 32 mmol/L   Glucose, Bld 93 70 - 99 mg/dL   BUN 5 (L) 6 - 23 mg/dL   Creatinine, Ser 9.140.84 0.50 - 1.35 mg/dL   Calcium 8.8 8.4 - 78.210.5 mg/dL   Total Protein 5.9 (L) 6.0 - 8.3 g/dL   Albumin 3.3 (L) 3.5 - 5.2 g/dL   AST 956678 (H) 0 - 37 U/L   ALT 293 (H) 0 - 53 U/L   Alkaline Phosphatase 46 39 - 117 U/L   Total Bilirubin 1.1 0.3 - 1.2 mg/dL   GFR calc non Af Amer >90 >90 mL/min   GFR calc Af Amer  >90 >90 mL/min   Anion gap 9 5 - 15      Assessment & Plan:   Problem List Items Addressed This Visit    Non-traumatic rhabdomyolysis - Primary    Reviewed hospital records w/ pt. Story and lab findings consistent with post-exercise rhabdomyolysis with associated transaminitis, all seem to be improving as expected.  No prior history of muscle dysfunction, no family history of muscle disorders. Discussed importance of good hydration status especially peri-workout. Recheck LFTs and CPK today Discussed slowly easing back to work-out and importance of moderation with exercise.      Relevant Orders   Hepatic function panel   CK       Follow up plan: Return if symptoms worsen or fail to improve.

## 2014-09-07 NOTE — Patient Instructions (Signed)
You are doing better. labwork today. We will need to slowly ease back into exercise routine.

## 2014-09-07 NOTE — Progress Notes (Signed)
Pre visit review using our clinic review tool, if applicable. No additional management support is needed unless otherwise documented below in the visit note. 

## 2014-09-07 NOTE — Assessment & Plan Note (Addendum)
Reviewed hospital records w/ pt. Story and lab findings consistent with post-exercise rhabdomyolysis with associated transaminitis, all seem to be improving as expected.  No prior history of muscle dysfunction, no family history of muscle disorders. Discussed importance of good hydration status especially peri-workout. Recheck LFTs and CPK today Discussed slowly easing back to work-out and importance of moderation with exercise.

## 2014-09-10 ENCOUNTER — Other Ambulatory Visit: Payer: Self-pay | Admitting: Family Medicine

## 2014-09-10 DIAGNOSIS — M6282 Rhabdomyolysis: Secondary | ICD-10-CM

## 2014-09-10 DIAGNOSIS — R740 Nonspecific elevation of levels of transaminase and lactic acid dehydrogenase [LDH]: Secondary | ICD-10-CM

## 2014-09-10 DIAGNOSIS — R7401 Elevation of levels of liver transaminase levels: Secondary | ICD-10-CM

## 2014-09-15 ENCOUNTER — Other Ambulatory Visit (INDEPENDENT_AMBULATORY_CARE_PROVIDER_SITE_OTHER): Payer: 59

## 2014-09-15 DIAGNOSIS — M6282 Rhabdomyolysis: Secondary | ICD-10-CM

## 2014-09-15 DIAGNOSIS — R7401 Elevation of levels of liver transaminase levels: Secondary | ICD-10-CM

## 2014-09-15 DIAGNOSIS — R740 Nonspecific elevation of levels of transaminase and lactic acid dehydrogenase [LDH]: Secondary | ICD-10-CM

## 2014-09-15 LAB — HEPATIC FUNCTION PANEL
ALK PHOS: 56 U/L (ref 39–117)
ALT: 68 U/L — AB (ref 0–53)
AST: 27 U/L (ref 0–37)
Albumin: 4.4 g/dL (ref 3.5–5.2)
Bilirubin, Direct: 0.2 mg/dL (ref 0.0–0.3)
Total Bilirubin: 0.8 mg/dL (ref 0.2–1.2)
Total Protein: 6.8 g/dL (ref 6.0–8.3)

## 2014-09-15 LAB — CK: Total CK: 242 U/L — ABNORMAL HIGH (ref 7–232)

## 2015-01-17 ENCOUNTER — Other Ambulatory Visit: Payer: Self-pay | Admitting: Family Medicine

## 2015-01-17 DIAGNOSIS — M6282 Rhabdomyolysis: Secondary | ICD-10-CM

## 2015-01-18 ENCOUNTER — Other Ambulatory Visit: Payer: 59

## 2015-01-23 ENCOUNTER — Encounter: Payer: Self-pay | Admitting: Family Medicine

## 2015-01-23 ENCOUNTER — Ambulatory Visit (INDEPENDENT_AMBULATORY_CARE_PROVIDER_SITE_OTHER): Payer: 59 | Admitting: Family Medicine

## 2015-01-23 VITALS — BP 112/68 | HR 64 | Temp 98.1°F | Ht 68.0 in | Wt 161.5 lb

## 2015-01-23 DIAGNOSIS — J302 Other seasonal allergic rhinitis: Secondary | ICD-10-CM

## 2015-01-23 DIAGNOSIS — R339 Retention of urine, unspecified: Secondary | ICD-10-CM

## 2015-01-23 DIAGNOSIS — N529 Male erectile dysfunction, unspecified: Secondary | ICD-10-CM

## 2015-01-23 DIAGNOSIS — Z0000 Encounter for general adult medical examination without abnormal findings: Secondary | ICD-10-CM | POA: Diagnosis not present

## 2015-01-23 DIAGNOSIS — R21 Rash and other nonspecific skin eruption: Secondary | ICD-10-CM | POA: Diagnosis not present

## 2015-01-23 DIAGNOSIS — M6282 Rhabdomyolysis: Secondary | ICD-10-CM | POA: Diagnosis not present

## 2015-01-23 LAB — COMPREHENSIVE METABOLIC PANEL
ALK PHOS: 45 U/L (ref 39–117)
ALT: 19 U/L (ref 0–53)
AST: 19 U/L (ref 0–37)
Albumin: 4.6 g/dL (ref 3.5–5.2)
BUN: 17 mg/dL (ref 6–23)
CO2: 29 mEq/L (ref 19–32)
CREATININE: 1.08 mg/dL (ref 0.40–1.50)
Calcium: 9.2 mg/dL (ref 8.4–10.5)
Chloride: 100 mEq/L (ref 96–112)
GFR: 86.89 mL/min (ref >60.00)
Glucose, Bld: 92 mg/dL (ref 70–99)
Potassium: 4.3 mEq/L (ref 3.5–5.1)
Sodium: 135 mEq/L (ref 135–145)
TOTAL PROTEIN: 6.6 g/dL (ref 6.0–8.3)
Total Bilirubin: 1.7 mg/dL — ABNORMAL HIGH (ref 0.2–1.2)

## 2015-01-23 LAB — POCT URINALYSIS DIPSTICK
Bilirubin, UA: NEGATIVE
Blood, UA: NEGATIVE
Glucose, UA: NEGATIVE
Ketones, UA: NEGATIVE
Leukocytes, UA: NEGATIVE
Nitrite, UA: NEGATIVE
Protein, UA: NEGATIVE
SPEC GRAV UA: 1.015
Urobilinogen, UA: 0.2
pH, UA: 6

## 2015-01-23 LAB — PSA: PSA: 0.46 ng/mL (ref 0.10–4.00)

## 2015-01-23 LAB — CK: CK TOTAL: 132 U/L (ref 7–232)

## 2015-01-23 MED ORDER — LEVOCETIRIZINE DIHYDROCHLORIDE 5 MG PO TABS: 5.0000 mg | ORAL_TABLET | Freq: Every evening | ORAL | Status: DC

## 2015-01-23 MED ORDER — SILDENAFIL CITRATE 50 MG PO TABS: 25.0000 mg | ORAL_TABLET | Freq: Every day | ORAL | Status: DC | PRN

## 2015-01-23 MED ORDER — CLOTRIMAZOLE 1 % EX CREA: 1.0000 "application " | TOPICAL_CREAM | Freq: Two times a day (BID) | CUTANEOUS | Status: DC

## 2015-01-23 MED ORDER — MICONAZOLE NITRATE 2 % EX POWD: CUTANEOUS | Status: DC | PRN

## 2015-01-23 NOTE — Assessment & Plan Note (Signed)
Check UA today. ?BPH related although very young age. See below.

## 2015-01-23 NOTE — Progress Notes (Signed)
BP 112/68 mmHg  Pulse 64  Temp(Src) 98.1 F (36.7 C) (Oral)  Ht 5\' 8"  (1.727 m)  Wt 161 lb 8 oz (73.256 kg)  BMI 24.56 kg/m2   CC: CPE  Subjective:    Patient ID: Daniel Cooper, male    DOB: Jan 09, 1986, 29 y.o.   MRN: [id number]  HPI: Daniel Cooper is a 29 y.o. male presenting on 01/23/2015 for Annual Exam   Recent exercise induced rhabdomyolysis has resolved.  Allergies worsening. OTC antihistamines ineffective. Also regularly takes singulair. flonase worsened rhinitis.  Skin rash present for months. First left foot then resolved now right dorsal foot. Highly itchy. No rash between toes. Shower causes scaling/desquamation. Some drainage with hot showers as well. So far has tried polysporin and tried antifungal x1.   1 yr ho decreased stream, some hesitancy. Some increased frequency as well with incomplete emptying. Intentionally doesn't drink when doing long car rides. No back pain or dysuria.   Increasing trouble with erectile dysfunction. S/p divorce. Now with new relationship. Wonders if psychological (after divorce). Trouble maintaining erection.   3rd shift for 8 yrs.  Preventative: Flu shot - doesn't receive Td 2010 Seat belt use discussed.  Sunscreen use discussed. No changing moles.  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 Interim medical history since our last visit reviewed. Allergies and medications reviewed and updated. Current Outpatient Prescriptions on File Prior to Visit  Medication Sig  . diphenhydrAMINE (BENADRYL) 25 MG tablet Take 25 mg by mouth every 6 (six) hours as needed for itching.  . montelukast (SINGULAIR) 10 MG tablet Take one tablet at bedtime   No current facility-administered  medications on file prior to visit.    Review of Systems  Constitutional: Negative for fever, chills, activity change, appetite change, fatigue and unexpected weight change.  HENT: Negative for hearing loss.   Eyes: Negative for visual disturbance.  Respiratory: Negative for cough, chest tightness, shortness of breath and wheezing.   Cardiovascular: Negative for chest pain, palpitations and leg swelling.  Gastrointestinal: Negative for nausea, vomiting, abdominal pain, diarrhea, constipation, blood in stool and abdominal distention.  Genitourinary: Negative for hematuria and difficulty urinating.  Musculoskeletal: Negative for myalgias, arthralgias and neck pain.  Skin: Negative for rash.  Neurological: Negative for dizziness, seizures, syncope and headaches.  Hematological: Negative for adenopathy. Does not bruise/bleed easily.  Psychiatric/Behavioral: Negative for dysphoric mood. The patient is not nervous/anxious.    Per HPI unless specifically indicated above     Objective:    BP 112/68 mmHg  Pulse 64  Temp(Src) 98.1 F (36.7 C) (Oral)  Ht 5\' 8"  (1.727 m)  Wt 161 lb 8 oz (73.256 kg)  BMI 24.56 kg/m2  Wt Readings from Last 3 Encounters:  01/23/15 161 lb 8 oz (73.256 kg)  09/07/14 158 lb (71.668 kg)  05/02/14 161 lb 1.9 oz (73.084 kg)    Physical Exam  Constitutional: He is oriented to person, place, and time. He appears well-developed and well-nourished. No distress.  HENT:  Head: Normocephalic and atraumatic.  Right Ear: Hearing, tympanic membrane, external ear and ear canal normal.  Left Ear: Hearing, tympanic membrane, external ear and ear canal normal.  Nose: Nose normal.  Mouth/Throat: Uvula is midline, oropharynx is clear  and moist and mucous membranes are normal. No oropharyngeal exudate, posterior oropharyngeal edema or posterior oropharyngeal erythema.  Eyes: Conjunctivae and EOM are normal. Pupils are equal, round, and reactive to light. No scleral icterus.    Neck: Normal range of motion. Neck supple. No thyromegaly present.  Cardiovascular: Normal rate, regular rhythm, normal heart sounds and intact distal pulses.   No murmur heard. Pulses:      Radial pulses are 2+ on the right side, and 2+ on the left side.  Pulmonary/Chest: Effort normal and breath sounds normal. No respiratory distress. He has no wheezes. He has no rales.  Abdominal: Soft. Bowel sounds are normal. He exhibits no distension and no mass. There is no tenderness. There is no rebound and no guarding.  Musculoskeletal: Normal range of motion. He exhibits no edema.  Lymphadenopathy:    He has no cervical adenopathy.  Neurological: He is alert and oriented to person, place, and time.  CN grossly intact, station and gait intact  Skin: Skin is warm and dry. Rash noted.  Erythematous papular rash dorsal R foot, maceration with skin breakdown between toes. Also with some macules at sole. Highly pruritic.  Psychiatric: He has a normal mood and affect. His behavior is normal. Judgment and thought content normal.  Nursing note and vitals reviewed.      Assessment & Plan:   Problem List Items Addressed This Visit    Erectile dysfunction    ?psychological more than organic given recent divorce and new relationship. Discussed this. Trial viagra for short course, coupon provided today. Discussed side effects to monitor for. If persistent trouble, return for further evaluation. Pt agrees with plan.      Health maintenance examination - Primary    Preventative protocols reviewed and updated unless pt declined. Discussed healthy diet and lifestyle.       Incomplete emptying of bladder    Check UA today. ?BPH related although very young age. See below.      Relevant Orders   PSA   Non-traumatic rhabdomyolysis    Recheck CK today.      Seasonal allergic rhinitis    OTC antihistamines ineffective. Will try xyzal. Nasal steroid has not helped. Continue singulair.  Discussed  nasal saline use and allergen avoidance.      Skin rash    Anticipate athlete's foot - with evidence of interdigital maceration. Treat with clotrimazole OTC BID x 4 wks + lotrimin AF powder in shoes. Update if persistent sxs after treatment to consider oral medication.          Follow up plan: Return in about 1 year (around 01/23/2016), or as needed, for annual exam, prior fasting for blood work.

## 2015-01-23 NOTE — Addendum Note (Signed)
Addended by: Baldomero LamyHAVERS, NATASHA C on: 01/23/2015 03:24 PM   Modules accepted: Kipp BroodSmartSet

## 2015-01-23 NOTE — Patient Instructions (Addendum)
Let's try prescription antihistamine xyzal sent to pharmacy labwork today. You are doing well today, call us with questions. Return as needed or in 1 year for next physical. Check urinalysis today. Trial viagra (coupon provided today).  For feet - clotrimazole cream twice daily for 1 month (over the counter) + lotrimin AF powder in shoes.

## 2015-01-23 NOTE — Assessment & Plan Note (Signed)
Preventative protocols reviewed and updated unless pt declined. Discussed healthy diet and lifestyle.  

## 2015-01-23 NOTE — Assessment & Plan Note (Signed)
Recheck CK today. 

## 2015-01-23 NOTE — Addendum Note (Signed)
Addended by: Josph MachoANCE, Montavis Schubring A on: 01/23/2015 10:07 AM   Modules accepted: Orders

## 2015-01-23 NOTE — Progress Notes (Signed)
Pre visit review using our clinic review tool, if applicable. No additional management support is needed unless otherwise documented below in the visit note. 

## 2015-01-23 NOTE — Assessment & Plan Note (Signed)
OTC antihistamines ineffective. Will try xyzal. Nasal steroid has not helped. Continue singulair.  Discussed nasal saline use and allergen avoidance.

## 2015-01-23 NOTE — Assessment & Plan Note (Signed)
Anticipate athlete's foot - with evidence of interdigital maceration. Treat with clotrimazole OTC BID x 4 wks + lotrimin AF powder in shoes. Update if persistent sxs after treatment to consider oral medication.

## 2015-01-23 NOTE — Assessment & Plan Note (Signed)
?  psychological more than organic given recent divorce and new relationship. Discussed this. Trial viagra for short course, coupon provided today. Discussed side effects to monitor for. If persistent trouble, return for further evaluation. Pt agrees with plan.

## 2015-01-25 ENCOUNTER — Encounter: Payer: Self-pay | Admitting: *Deleted

## 2015-03-08 ENCOUNTER — Telehealth: Payer: Self-pay | Admitting: Family Medicine

## 2015-03-08 NOTE — Telephone Encounter (Signed)
Records request has been faxed.

## 2015-03-08 NOTE — Telephone Encounter (Signed)
I need the [facility] records send to do the forms.  Please make sure those have been requested.  Thanks.

## 2015-03-08 NOTE — Telephone Encounter (Signed)
Pt dropped off fmla paperwork due to being off for 3 days for hand foot and mouth disease.  [facility] went [facility] and was diagnosed [facility], but they dont do any fmla paperwork, and it is company policy that he must have fmla paperwork filled out.  Placing paperwork on Gina's desk.  Pt has signed release form to have records from [facility] sent to [place]. Best call back number is cell number.

## 2015-03-08 NOTE — Telephone Encounter (Signed)
Now placing paperwork in Dr Lianne Bushy inbox, thanks.

## 2015-03-15 DIAGNOSIS — Z7689 Persons encountering health services in other specified circumstances: Secondary | ICD-10-CM

## 2015-06-11 ENCOUNTER — Other Ambulatory Visit: Payer: Self-pay | Admitting: Family Medicine

## 2016-07-15 ENCOUNTER — Other Ambulatory Visit: Payer: Self-pay | Admitting: Family Medicine

## 2016-10-08 ENCOUNTER — Ambulatory Visit (INDEPENDENT_AMBULATORY_CARE_PROVIDER_SITE_OTHER): Payer: 59 | Admitting: Internal Medicine

## 2016-10-08 ENCOUNTER — Encounter: Payer: Self-pay | Admitting: Internal Medicine

## 2016-10-08 VITALS — BP 104/60 | HR 73 | Temp 98.0°F | Wt 166.0 lb

## 2016-10-08 DIAGNOSIS — J029 Acute pharyngitis, unspecified: Secondary | ICD-10-CM

## 2016-10-08 DIAGNOSIS — J069 Acute upper respiratory infection, unspecified: Secondary | ICD-10-CM | POA: Insufficient documentation

## 2016-10-08 MED ORDER — CLINDAMYCIN HCL 300 MG PO CAPS: 300.0000 mg | ORAL_CAPSULE | Freq: Three times a day (TID) | ORAL | 0 refills | Status: DC

## 2016-10-08 NOTE — Patient Instructions (Signed)
Call next week for blood work if you are not feeling considerably better.

## 2016-10-08 NOTE — Progress Notes (Signed)
   Subjective:    Patient ID: Daniel Cooper, male    DOB: [birth year], 31 y.o.   MRN: [id number]  HPI Here due to respiratory infection  Has been sick for 2 weeks or more Just not getting better so he came in Headache, SOB, dizziness, stuffy nose, ear pressure, throat pressure, achy Got really fatigued at the beginning of this (after working out) Hasn't missed work  No cough No fever No chills or sig sweats (gets hot at night) Tight feeling in upper chest--"has to focus on breathing"  Tried allegra and singulair 2 days ago---not really helping  Current Outpatient Prescriptions on File Prior to Visit  Medication Sig Dispense Refill  . diphenhydrAMINE (BENADRYL) 25 MG tablet Take 25 mg by mouth every 6 (six) hours as needed for itching.    . montelukast (SINGULAIR) 10 MG tablet TAKE ONE TABLET BY MOUTH AT BEDTIME 90 tablet 1   No current facility-administered medications on file prior to visit.     Allergies  Allergen Reactions  . Penicillins     REACTION: hives    Past Medical History:  Diagnosis Date  . History of rhabdomyolysis 08/2014   after intense workout  . Seasonal allergies   . VSD (ventricular septal defect)    told closed    Past Surgical History:  Procedure Laterality Date  . TONSILLECTOMY      Family History  Problem Relation Age of Onset  . Stroke Maternal Uncle 40  . Prostate cancer Paternal Grandfather [id number]  . Coronary artery disease Maternal Grandmother     CABG; +smoker  . Hypertension      some in family    Social History   Social History  . Marital status: Single    Spouse name: N/A  . Number of children: N/A  . Years of education: N/A   Occupational History  . 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 Smoking status: Never Smoker  . Smokeless tobacco: Never Used  . Alcohol use Yes     Comment: Occasional  . Drug use: No  . Sexual activity: Not on file   Other Topics Concern  . 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 No distress.  HENT:  No sinus tenderness TMs normal Mild nasal inflammation Slight pharyngeal injection without exudate  Neck: Normal range of motion. Neck supple. No thyromegaly present.  Non tender bilateral cervical adenopathy  Pulmonary/Chest: Effort normal and breath sounds normal. No respiratory distress. He has no wheezes. He has no rales.  Skin: No rash noted.          Assessment & Plan:

## 2016-10-08 NOTE — Progress Notes (Signed)
Pre visit review using our clinic review tool, if applicable. No additional management support is needed unless otherwise documented below in the visit note. 

## 2016-10-08 NOTE — Assessment & Plan Note (Signed)
Has throat and chest tightness--was exposed to strep in past ?some sinus infection ---not clear cut Will try empiric clinda --sick over 2 weeks Consider blood work if not better next week

## 2016-12-15 DIAGNOSIS — J019 Acute sinusitis, unspecified: Secondary | ICD-10-CM | POA: Diagnosis not present

## 2016-12-31 ENCOUNTER — Telehealth: Payer: Self-pay | Admitting: Family Medicine

## 2016-12-31 NOTE — Telephone Encounter (Signed)
[facility] Client TELEPHONE ADVICE RECORD [facility] Patient Name: Daniel GarfinkelJOHN Cooper DOB: 1986-04-23 Initial Comment Caller says he gets on and off headaches through out the day, Foggy vision, worn down and really tired and an hour later he will be fine. He has allergies and he takes multiple medications, he just got done with medication for a sinus infection. He also says when he move his shoulders sometimes his neck hurts and he has had some restrictions. He needs to be triaged in order to make an apt Nurse Assessment Nurse: Clarita LeberWomble, RN, Gavin Poundeborah Date/Time (Eastern Time): 12/31/2016 8:59:30 AM Confirm and document reason for call. If symptomatic, describe symptoms. ---The caller states that he has been having headaches on and off. He has been taking for Allegra and Singulair, and Flonase. He states that he recently was diagnosed with a sinus infection at an UC and he was put on a 10 day series of antibiotics. He states that the most intense pain is in the temple and top of his head. He states that everything that he was doing is not working now. He did have a fever of 100.5 when he went to UC. Does the patient have any new or worsening symptoms? ---Yes Will a triage be completed? ---Yes Related visit to physician within the last 2 weeks? ---No Does the PT have any chronic conditions? (i.e. diabetes, asthma, etc.) ---No Is this a behavioral health or substance abuse call? ---No Guidelines Guideline Title Affirmed Question Affirmed Notes Headache [1] MILD-MODERATE headache AND [2] present > 72 hours Final Disposition User See PCP When Office is Open (within 3 days) Clarita LeberWomble, RN, Information systems managerDeborah Disagree/Comply: Comply

## 2016-12-31 NOTE — Telephone Encounter (Signed)
Pt has appt with Dr Dayton MartesAron on 01/01/17 at 9 AM.

## 2017-01-01 ENCOUNTER — Ambulatory Visit (INDEPENDENT_AMBULATORY_CARE_PROVIDER_SITE_OTHER): Payer: 59 | Admitting: Family Medicine

## 2017-01-01 ENCOUNTER — Encounter: Payer: Self-pay | Admitting: Family Medicine

## 2017-01-01 VITALS — BP 130/80 | HR 86 | Ht 68.0 in | Wt 165.0 lb

## 2017-01-01 DIAGNOSIS — J329 Chronic sinusitis, unspecified: Secondary | ICD-10-CM | POA: Diagnosis not present

## 2017-01-01 DIAGNOSIS — J302 Other seasonal allergic rhinitis: Secondary | ICD-10-CM

## 2017-01-01 DIAGNOSIS — R51 Headache: Secondary | ICD-10-CM | POA: Diagnosis not present

## 2017-01-01 DIAGNOSIS — R519 Headache, unspecified: Secondary | ICD-10-CM | POA: Insufficient documentation

## 2017-01-01 LAB — COMPREHENSIVE METABOLIC PANEL
ALT: 26 U/L (ref 0–53)
AST: 21 U/L (ref 0–37)
Albumin: 4.8 g/dL (ref 3.5–5.2)
Alkaline Phosphatase: 49 U/L (ref 39–117)
BILIRUBIN TOTAL: 1.2 mg/dL (ref 0.2–1.2)
BUN: 20 mg/dL (ref 6–23)
CALCIUM: 9.5 mg/dL (ref 8.4–10.5)
CO2: 29 mEq/L (ref 19–32)
Chloride: 103 mEq/L (ref 96–112)
Creatinine, Ser: 1.18 mg/dL (ref 0.40–1.50)
GFR: 77.37 mL/min (ref >60.00)
GLUCOSE: 102 mg/dL — AB (ref 70–99)
POTASSIUM: 4.4 meq/L (ref 3.5–5.1)
Sodium: 137 mEq/L (ref 135–145)
Total Protein: 7 g/dL (ref 6.0–8.3)

## 2017-01-01 LAB — CBC WITH DIFFERENTIAL/PLATELET
BASOS PCT: 0.4 % (ref 0.0–3.0)
Basophils Absolute: 0 10*3/uL (ref 0.0–0.1)
EOS PCT: 1.9 % (ref 0.0–5.0)
Eosinophils Absolute: 0.1 10*3/uL (ref 0.0–0.7)
HCT: 45.9 % (ref 39.0–52.0)
HEMOGLOBIN: 16.2 g/dL (ref 13.0–17.0)
Lymphocytes Relative: 33.3 % (ref 12.0–46.0)
Lymphs Abs: 1.7 10*3/uL (ref 0.7–4.0)
MCHC: 35.3 g/dL (ref 30.0–36.0)
MCV: 85 fl (ref 78.0–100.0)
MONOS PCT: 5.9 % (ref 3.0–12.0)
Monocytes Absolute: 0.3 10*3/uL (ref 0.1–1.0)
Neutro Abs: 2.9 10*3/uL (ref 1.4–7.7)
Neutrophils Relative %: 58.5 % (ref 43.0–77.0)
Platelets: 199 10*3/uL (ref 150.0–400.0)
RBC: 5.4 Mil/uL (ref 4.22–5.81)
RDW: 13.2 % (ref 11.5–15.5)
WBC: 5 10*3/uL (ref 4.0–10.5)

## 2017-01-01 LAB — VITAMIN B12: VITAMIN B 12: 599 pg/mL (ref 211–911)

## 2017-01-01 LAB — VITAMIN D 25 HYDROXY (VIT D DEFICIENCY, FRACTURES): VITD: 36.68 ng/mL (ref 30.00–100.00)

## 2017-01-01 LAB — HEMOGLOBIN A1C: HEMOGLOBIN A1C: 5.4 % (ref 4.6–6.5)

## 2017-01-01 LAB — TSH: TSH: 1.18 u[IU]/mL (ref 0.35–4.50)

## 2017-01-01 MED ORDER — AZITHROMYCIN 250 MG PO TABS: ORAL_TABLET | ORAL | 0 refills | Status: DC

## 2017-01-01 MED ORDER — PREDNISONE 20 MG PO TABS: 40.0000 mg | ORAL_TABLET | Freq: Every day | ORAL | 0 refills | Status: DC

## 2017-01-01 NOTE — Progress Notes (Signed)
Subjective:   Patient ID: Daniel Cooper, male    DOB: [birth year], 31 y.o.   MRN: [id number]  Daniel Cooper is a pleasant 31 y.o. year old male patient of Dr. Reece Agar, new to me, who presents to clinic today with Headache; Fatigue; Shaking; and Neck Pain  on 01/01/2017  HPI:  Several complaints-  1.  HA- went to walk in clinic last week and was given a "penicillin related antibiotic" for sinusitis which he can take.  He is allergic to PCN.  Cannot remember the name.    Some of the sinus pressure improved but over past few days, it is much worse.  HA, pressure.    2.  Seasonal allergies- taking Allegra, singulair and flonase daily.  This seems to be working ok but now breaking out in hives when he touches a lot of things out doors.  Wants to see an allergist for testing.   3.  Neck pain- recurrent issue.  Pulled a muscle on right side of his neck in 05/2016. No UE weakness  4.  Sometimes feels shaky if he hasn't eaten in a while.  Grandfather has diabetes.  He tries to stay hydrated but denies increased thirst or urination.  Lab Results  Component Value Date   TSH 0.780 09/02/2014     Current Outpatient Prescriptions on File Prior to Visit  Medication Sig Dispense Refill  . cetirizine (ZYRTEC) 10 MG tablet Take 10 mg by mouth daily.    . diphenhydrAMINE (BENADRYL) 25 MG tablet Take 25 mg by mouth every 6 (six) hours as needed for itching.    . montelukast (SINGULAIR) 10 MG tablet TAKE ONE TABLET BY MOUTH AT BEDTIME 90 tablet 1   No current facility-administered medications on file prior to visit.     Allergies  Allergen Reactions  . Penicillins     REACTION: hives    Past Medical History:  Diagnosis Date  . History of rhabdomyolysis 08/2014   after intense workout  . Seasonal allergies   . VSD (ventricular septal defect)    told closed    Past Surgical History:  Procedure Laterality Date  . TONSILLECTOMY      Family History  Problem Relation Age of Onset   . Stroke Maternal Uncle 40  . Prostate cancer Paternal Grandfather 76  . Coronary artery disease Maternal Grandmother        CABG; +smoker  . Hypertension Unknown        some in family    Social History   Social History  . Marital status: Single    Spouse name: N/A  . Number of children: N/A  . Years of education: N/A   Occupational History  . 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 Smoking status: Never Smoker  . Smokeless tobacco: Never Used  . Alcohol use Yes     Comment: Occasional  . Drug use: No  . Sexual activity: Not on file   Other Topics Concern  . Not on file   Social History Narrative   Caffeine: 4 cans of soda   Divorced   Lives with friends, 5 dogs   Monogamous   Curator at [employer], 3rd shift    The PMH, PSH, Social History, Family History, Medications, and allergies have been reviewed in [facility], and have been updated if relevant.    Review of Systems  Constitutional: Negative.   HENT: Positive for postnasal drip, rhinorrhea, sinus pain and  sinus pressure. Negative for trouble swallowing and voice change.   Eyes: Negative.   Respiratory: Positive for cough. Negative for shortness of breath and wheezing.   Cardiovascular: Negative.   Gastrointestinal: Negative.   Endocrine: Negative.   Genitourinary: Negative.   Musculoskeletal: Positive for neck pain and neck stiffness. Negative for back pain, gait problem, joint swelling and myalgias.  Allergic/Immunologic: Positive for environmental allergies.  Neurological: Positive for tremors and headaches. Negative for dizziness, seizures, syncope, facial asymmetry, speech difficulty, weakness, light-headedness and numbness.  Hematological: Negative.   Psychiatric/Behavioral: Negative.   All other systems reviewed and are negative.      Objective:    BP 130/80   Pulse 86   Ht 5\' 8"  (1.727 m)   Wt 165 lb (74.8 kg)   SpO2 98%   BMI 25.09 kg/m    Physical Exam    Constitutional: He is oriented to person, place, and time. He appears well-developed and well-nourished. No distress.  HENT:  Head: Normocephalic and atraumatic.  Right Ear: Hearing and tympanic membrane normal.  Left Ear: Hearing and tympanic membrane normal.  Nose: Mucosal edema present. No epistaxis. Right sinus exhibits frontal sinus tenderness. Left sinus exhibits frontal sinus tenderness.  Mouth/Throat: Uvula is midline.  Eyes: Conjunctivae are normal.  Neck: Neck supple.  Cardiovascular: Normal rate and regular rhythm.   Pulmonary/Chest: Effort normal and breath sounds normal.  Musculoskeletal: Normal range of motion. He exhibits no edema.  Neurological: He is alert and oriented to person, place, and time. No cranial nerve deficit. Coordination normal.  Skin: Skin is warm and dry. He is not diaphoretic.  Psychiatric: He has a normal mood and affect. His behavior is normal. Judgment and thought content normal.  Nursing note and vitals reviewed.         Assessment & Plan:   Seasonal allergic rhinitis, unspecified trigger - Plan: Ambulatory referral to Allergy  Recurrent sinus infections  Frequent headaches - Plan: CBC with Differential/Platelet, Comprehensive metabolic panel, TSH, Hemoglobin A1c, Vitamin B12, Vitamin D, 25-hydroxy No Follow-up on file.

## 2017-01-01 NOTE — Patient Instructions (Signed)
Great to meet you.  Take zpack and prednisone as directed.   We will call you with your lab results and an appointment to see an allergist.

## 2017-01-01 NOTE — Assessment & Plan Note (Addendum)
Probable persistent sinusitis. Given degree of inflammation will treat with zpack and 1 week course of prednisone. Also refer to an allergist. Continue flonase, singulair and allegra daily. Call or return to clinic prn if these symptoms worsen or fail to improve as anticipated. The patient indicates understanding of these issues and agrees with the plan. Will also get lab work today. Orders Placed This Encounter  Procedures  . CBC with Differential/Platelet  . Comprehensive metabolic panel  . TSH  . Hemoglobin A1c  . Vitamin B12  . Vitamin D, 25-hydroxy  . Ambulatory referral to Allergy

## 2017-01-15 ENCOUNTER — Encounter: Payer: Self-pay | Admitting: Allergy & Immunology

## 2017-01-15 ENCOUNTER — Ambulatory Visit (INDEPENDENT_AMBULATORY_CARE_PROVIDER_SITE_OTHER): Payer: 59 | Admitting: Allergy & Immunology

## 2017-01-15 VITALS — BP 104/72 | HR 71 | Temp 98.2°F | Resp 14 | Ht 67.0 in | Wt 160.0 lb

## 2017-01-15 DIAGNOSIS — G43809 Other migraine, not intractable, without status migrainosus: Secondary | ICD-10-CM | POA: Diagnosis not present

## 2017-01-15 DIAGNOSIS — J301 Allergic rhinitis due to pollen: Secondary | ICD-10-CM | POA: Diagnosis not present

## 2017-01-15 DIAGNOSIS — Z880 Allergy status to penicillin: Secondary | ICD-10-CM

## 2017-01-15 DIAGNOSIS — J329 Chronic sinusitis, unspecified: Secondary | ICD-10-CM

## 2017-01-15 MED ORDER — METHYLPREDNISOLONE ACETATE 40 MG/ML IJ SUSP
40.0000 mg | Freq: Once | INTRAMUSCULAR | Status: AC
  Administered 2017-01-15: 40 mg via INTRAMUSCULAR

## 2017-01-15 MED ORDER — OLOPATADINE HCL 0.7 % OP SOLN: 1.0000 [drp] | Freq: Every day | OPHTHALMIC | 5 refills | Status: DC | PRN

## 2017-01-15 MED ORDER — EPINEPHRINE 0.3 MG/0.3ML IJ SOAJ: 0.3000 mg | Freq: Once | INTRAMUSCULAR | 1 refills | Status: AC

## 2017-01-15 MED ORDER — LEVOCETIRIZINE DIHYDROCHLORIDE 5 MG PO TABS: 5.0000 mg | ORAL_TABLET | Freq: Every evening | ORAL | 5 refills | Status: DC

## 2017-01-15 MED ORDER — AZELASTINE-FLUTICASONE 137-50 MCG/ACT NA SUSP: NASAL | 5 refills | Status: DC

## 2017-01-15 NOTE — Progress Notes (Signed)
NEW PATIENT  Date of Service/Encounter:  01/15/17  Referring provider: Eustaquio Boyden, MD   Assessment:   Non-seasonal allergic rhinitis due to pollen (trees, weeds, ragweed, grasses, molds, dust mites, cat)  Headaches - likely migraine  Penicillin allergy - low risk  Plan/Recommendations:   1. Chronic allergic rhinitis - Testing today showed: trees, weeds, grasses, molds, dust mites and cat - Avoidance measures provided. - Stop the Flonase.  - Start Xyzal (levocetirizine) 5mg  tablet once daily, Dymista (fluticasone/azelastine) two sprays per nostril 1-2 times daily as needed and Pazeo (olopatadine) one drop per eye daily as needed  - You can also use extra doses of your other antihistamines as needed for particularly bad days. - Claritin is a rather weak antihistamine, so I would avoid this.  - Consider allergy shots as a means of long-term control. - Allergy shots "re-train" the immune system to ignore environmental allergens and decrease the resulting immune response to those allergens.  - Allergy consent signed today. - Make an appointment in two weeks for your first injection.   2. Migraine - I would talk to your PCP about starting a migraine medication. - Your uncontrolled allergies could be exacerbating the headaches, but I think you also have underlying migraines as well.  3. Penicillin allergy - with low risk history - Given the duration of time that has elapsed since the reaction, Kaydyn is low-risk for having a reaction in the future. - The history of hives during childhood are likely more related to a concurrent viral infection rather than a true allergy to the medication. - We can safely challenge him in the office setting at a future appointment.  - This will open up another class of antibiotics that can be used for future infections.  4. Return in about 3 months (around 04/17/2017) for a drug challenge.  Subjective:   DONTEE JASO III is a 31 y.o. male  presenting today for evaluation of  Chief Complaint  Patient presents with  . New Evaluation  . Allergy Testing    environmental     JAZIER MCGLAMERY III has a history of the following: Patient Active Problem List   Diagnosis Date Noted  . Recurrent sinus infections 01/01/2017  . Frequent headaches 01/01/2017  . Upper respiratory infection 10/08/2016  . Health maintenance examination 01/23/2015  . Skin rash 01/23/2015  . Incomplete emptying of bladder 01/23/2015  . Erectile dysfunction 01/23/2015  . Non-traumatic rhabdomyolysis 09/07/2014  . Seasonal allergic rhinitis 11/28/2010    History obtained from: chart review and patient and his girlfriend.  [name] was referred by Eustaquio Boyden, MD.     Sayvon is a 31 y.o. male presenting for an allergy evaluation. He has had sinus issues since high school and middle school. He currently alternates between Claritin, Allegra, and Zyrtec. He has had Xyzal and he is unsure whether this helps. He takes Singulair 10mg  daily throughout the year. He also uses Mucinex and Pseudofed intermittently. Flonase was started when he was in high school and he uses this intermittently. He goes through multiple boxes of Kleenex. He was recently treated with prednisone which did help somewhat, but did not clear up his symptoms completely. He was treated with two back to back courses of antibiotics. He was on cefdinir two rounds ago and then was treated with another antibiotic more recently for seven days. He also endorses a rash when he mows hay. He has never been allergy tested.  Shaune does have headaches,  although they are not always associated with the sinus pain and pressure. He does endorse vision changes with the headaches. The headache ranks at a 5/10 at baseline and does worsen occasionally. He also has light and sound sensitivity. Ibuprofen and acetaminophen do not seem to help the headaches. He has never tried any prescription drugs for the  headaches.   Josealberto does have a history of a penicillin allergy as well. Per his mother via test, he developed hives over his entire body within minutes. He had no systemic complaints aside from the rash. Parents did treat with benadryl with rather quick resolution of the symptoms. He is unsure why he was being treated with penicillin. In any case, it has been over two decades since the reaction occurred.   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 He does have worsening headaches and allergies at work. He does have problems with exhaust in his shop. He has never needed prednisone for his breathing and has never been in the ED for his symptoms. Otherwise, there is no history of other atopic diseases, including food allergies, stinging insect allergies, or urticaria. There is no significant infectious history. Vaccinations are up to date.   Past Medical History: Patient Active Problem List   Diagnosis Date Noted  . Recurrent sinus infections 01/01/2017  . Frequent headaches 01/01/2017  . Upper respiratory infection 10/08/2016  . Health maintenance examination 01/23/2015  . Skin rash 01/23/2015  . Incomplete emptying of bladder 01/23/2015  . Erectile dysfunction 01/23/2015  . Non-traumatic rhabdomyolysis 09/07/2014  . Seasonal allergic rhinitis 11/28/2010    Medication List:  Allergies as of 01/15/2017      Reactions   Penicillins    REACTION: hives      Medication List       Accurate as of 01/15/17  6:34 PM. Always use your most recent med list.          Azelastine-Fluticasone 137-50 MCG/ACT Susp 2 sprays in each nostril 1-2 times daily.   diphenhydrAMINE 25 MG tablet Commonly known as:  BENADRYL Take 25 mg by mouth every 6 (six) hours as needed for itching.   EPINEPHrine 0.3 mg/0.3 mL Soaj injection Commonly known as:  AUVI-Q Inject 0.3 mLs (0.3 mg total) into the muscle once.   fexofenadine 180 MG tablet Commonly known as:  ALLEGRA Take 180 mg by mouth  daily.   levocetirizine 5 MG tablet Commonly known as:  XYZAL Take 1 tablet (5 mg total) by mouth every evening.   montelukast 10 MG tablet Commonly known as:  SINGULAIR TAKE ONE TABLET BY MOUTH AT BEDTIME   MULTIVITAMIN ADULT PO Take by mouth daily.   Olopatadine HCl 0.7 % Soln Commonly known as:  PAZEO Apply 1 drop to eye daily as needed.       Birth History: non-contributory. He was born at [redacted] wks gestation. He was in the NICU for a period of time but did not have any long standing complications from this.  Developmental History: non-contributory.   Past Surgical History: Past Surgical History:  Procedure Laterality Date  . TONSILLECTOMY       Family History: Family History  Problem Relation Age of Onset  . Stroke Maternal Uncle 40  . Prostate cancer Paternal Grandfather 50  . Coronary artery disease Maternal Grandmother        CABG; +smoker  . Hypertension Unknown        some in family     Social History: Armani lives at home with his  girlfriend of several years. He lives in a house built in the [id number]. There is carpeting throughout the home. There is no water damage or roaches. They have gas heating and central cooling. There are dogs inside the home. He does not have dust mite covers on his bedding. There is no tobacco exposure. 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 His shop does provide exposure to fumes as well as various forms of dust.     Review of Systems: a 14-point review of systems is pertinent for what is mentioned in HPI.  Otherwise, all other systems were negative. Constitutional: negative other than that listed in the HPI Eyes: negative other than that listed in the HPI Ears, nose, mouth, throat, and face: negative other than that listed in the HPI Respiratory: negative other than that listed in the HPI Cardiovascular: negative other than that listed in the HPI Gastrointestinal: negative other than that listed in the  HPI Genitourinary: negative other than that listed in the HPI Integument: negative other than that listed in the HPI Hematologic: negative other than that listed in the HPI Musculoskeletal: negative other than that listed in the HPI Neurological: negative other than that listed in the HPI Allergy/Immunologic: negative other than that listed in the HPI    Objective:   Blood pressure 104/72, pulse 71, temperature 98.2 F (36.8 C), temperature source Oral, resp. rate 14, height 5\' 7"  (1.702 m), weight 160 lb (72.6 kg), SpO2 97 %. Body mass index is 25.06 kg/m.   Physical Exam:  General: Alert, interactive, in no acute distress. Fit pleasant male.  Eyes: No conjunctival injection present on the right, No conjunctival injection present on the left, PERRL bilaterally, No discharge on the right, No discharge on the left and No Horner-Trantas dots present Ears: Right OME, Left OME, Right TM intact without perforation and Left TM intact without perforation.  Nose/Throat: External nose within normal limits, nasal crease present and septum midline, there is a right sided polyp versus mucous plug on the right side, turbinates markedly edematous and pale with clear discharge, post-pharynx erythematous with cobblestoning in the posterior oropharynx. Tonsils 2+ without exudates Neck: Supple without thyromegaly.  Adenopathy: no enlarged lymph nodes appreciated in the anterior cervical, occipital, axillary, epitrochlear, inguinal, or popliteal regions Lungs: Clear to auscultation without wheezing, rhonchi or rales. No increased work of breathing. CV: Normal S1/S2, no murmurs. Capillary refill <2 seconds.  Abdomen: Nondistended, nontender. No guarding or rebound tenderness. Bowel sounds present in all fields and hyperactive  Skin: Warm and dry, without lesions or rashes. Tattoo present on the left arm.  Extremities:  No clubbing, cyanosis or edema. Neuro:   Grossly intact. No focal deficits appreciated.  Responsive to questions.  Diagnostic studies:   Allergy Studies:   Indoor/Outdoor Percutaneous Adult Environmental Panel: positive to bahia grass, [place] grass, johnson grass, [place] blue grass, meadow fescue grass, perennial rye grass, sweet vernal grass, timothy grass, English plantain, Df mite, Dp mites and cat. Otherwise negative with adequate controls.  Indoor/Outdoor Selected Intradermal Environmental Panel: positive to ragweed mix, weed mix, tree mix, mold mix #2 and mold mix #4. Otherwise negative with adequate controls.      Malachi BondsJoel Kavion Mancinas, MD FAAAAI [facility]

## 2017-01-15 NOTE — Patient Instructions (Addendum)
1. Chronic allergic rhinitis - Testing today showed: trees, weeds, grasses, molds, dust mites and cat - Avoidance measures provided. - Stop the Flonase.  - Start Xyzal (levocetirizine) 5mg  tablet once daily, Dymista (fluticasone/azelastine") two sprays per nostril 1-2 times daily as needed and Pazeo (olopatadine) one drop per eye daily as needed  - You can also use extra doses of your other antihistamines as needed for particularly bad days. - Claritin is a rather weak antihistamine, so I would avoid this.  - Consider allergy shots as a means of long-term control. - Allergy shots "re-train" the immune system to ignore environmental allergens and decrease the resulting immune response to those allergens.  - Allergy consent signed today. - Make an appointment in two weeks for your first injection.   2. Migraine - I would talk to your PCP about starting a migraine medication. - Your uncontrolled allergies could be exacerbating the headaches, but I think you also have underlying migraines as well.  3. Return in about 3 months (around 04/17/2017) for a drug challenge.  Please inform us of any Emergency Department visits, hospitalizations, or changes in symptoms. Call us before going to the ED for breathing or allergy symptoms since we might be able to fit you in for a sick visit. Feel free to contact us anytime with any questions, problems, or concerns.  It was a pleasure to see you again today! Happy summer!   Websites that have reliable patient information: 1. American Academy of Asthma, Allergy, and Immunology: www.aaaai.org 2. Food Allergy Research and Education (FARE): foodallergy.org 3. Mothers of Asthmatics: http://www.asthmacommunitynetwork.org 4. American College of Allergy, Asthma, and Immunology: www.acaai.org

## 2017-01-16 ENCOUNTER — Telehealth: Payer: Self-pay | Admitting: *Deleted

## 2017-01-16 NOTE — Telephone Encounter (Signed)
Received PA for pazeo insurance prefers azelastine, ketotifen and epinastine. Also Dymista not covered could we split these. Dr Dellis AnesGallagher please advise

## 2017-01-16 NOTE — Telephone Encounter (Signed)
Daniel DaftKayla is working on Marshall & IlsleyPA for Duke EnergyDymista/

## 2017-01-16 NOTE — Telephone Encounter (Signed)
[place] [facility]. Will follow up on Monday. Patient was given samples in office of [facility].

## 2017-01-16 NOTE — Telephone Encounter (Signed)
Signed form and faxed.  Malachi BondsJoel Pang Robers, MD FAAAAI [facility] [place] [place]

## 2017-01-19 ENCOUNTER — Other Ambulatory Visit: Payer: Self-pay

## 2017-01-19 MED ORDER — FLUTICASONE PROPIONATE 50 MCG/ACT NA SUSP: 2.0000 | Freq: Two times a day (BID) | NASAL | 5 refills | Status: DC

## 2017-01-19 MED ORDER — OLOPATADINE HCL 0.2 % OP SOLN: 1.0000 [drp] | Freq: Every day | OPHTHALMIC | 5 refills | Status: DC

## 2017-01-19 MED ORDER — AZELASTINE HCL 137 MCG/SPRAY NA SOLN: 2.0000 | Freq: Two times a day (BID) | NASAL | 5 refills | Status: DC

## 2017-01-19 NOTE — Telephone Encounter (Signed)
[facility] in regards to an [facility] for Pazeo. I consulted with Dr. Dellis AnesGallagher and he asked me to send in Pataday. [facility], on [address] [place].

## 2017-01-19 NOTE — Addendum Note (Signed)
Addended by: Mliss FritzBLACK, Ellouise Mcwhirter I on: 01/19/2017 02:25 PM   Modules accepted: Orders

## 2017-01-29 NOTE — Addendum Note (Signed)
Addended by: Alfonse SpruceGALLAGHER, Relda Agosto LOUIS on: 01/29/2017 01:34 PM   Modules accepted: Orders

## 2017-01-29 NOTE — Progress Notes (Addendum)
I talked to the patient and he is interested in pursuing allergen immunotherapy. Immunotherapy script written and routed to the Immunotherapy Team. He will make an appointment in two weeks for his first injection. He will be getting his shots at an outside facility and is aware that he will receive his first injection of each new vial in our clinic. Daniel Cooper has already been sent in.   [name], MD FAAAAI [facility] [place] [place]

## 2017-02-02 NOTE — Progress Notes (Signed)
I talked to Daniel Cooper and he continues to have sinus pressure. Nasal discharge has improved but he continues to have congestion. He is s/p three courses of back to back antibiotics and two prednisone burst with minimal improvement. I will order a sinus CT to rule out sinus disease and then consider ENT referral thereafter.   Daniel BondsJoel Gallagher, MD FAAAAI [facility] [place] [place]

## 2017-02-02 NOTE — Addendum Note (Signed)
Addended by: Alfonse SpruceGALLAGHER, Klein Willcox LOUIS on: 02/02/2017 08:37 AM   Modules accepted: Orders

## 2017-02-13 ENCOUNTER — Telehealth: Payer: Self-pay

## 2017-02-13 NOTE — Telephone Encounter (Signed)
Pt called stating he scheduled his sinus CT scan on Monday 02/16/17. Pt was approved from 02/13/17-03/30/17.  Approval code: [id number]

## 2017-02-16 ENCOUNTER — Ambulatory Visit: Payer: Self-pay | Admitting: Allergy & Immunology

## 2017-02-16 ENCOUNTER — Ambulatory Visit
Admission: RE | Admit: 2017-02-16 | Discharge: 2017-02-16 | Disposition: A | Discharge status: Home / Self Care | Payer: 59 | Source: Ambulatory Visit | Attending: Allergy & Immunology | Admitting: Allergy & Immunology

## 2017-02-16 DIAGNOSIS — J329 Chronic sinusitis, unspecified: Secondary | ICD-10-CM | POA: Diagnosis not present

## 2017-02-17 ENCOUNTER — Encounter: Payer: Self-pay | Admitting: Family Medicine

## 2017-02-17 ENCOUNTER — Ambulatory Visit: Payer: 59 | Admitting: Family Medicine

## 2017-02-17 ENCOUNTER — Ambulatory Visit (INDEPENDENT_AMBULATORY_CARE_PROVIDER_SITE_OTHER): Payer: 59 | Admitting: Family Medicine

## 2017-02-17 ENCOUNTER — Ambulatory Visit: Payer: 59

## 2017-02-17 VITALS — BP 118/72 | HR 61 | Temp 98.1°F | Wt 162.0 lb

## 2017-02-17 DIAGNOSIS — G44209 Tension-type headache, unspecified, not intractable: Secondary | ICD-10-CM | POA: Insufficient documentation

## 2017-02-17 DIAGNOSIS — G44219 Episodic tension-type headache, not intractable: Secondary | ICD-10-CM | POA: Diagnosis not present

## 2017-02-17 MED ORDER — CYCLOBENZAPRINE HCL 5 MG PO TABS: 2.5000 mg | ORAL_TABLET | Freq: Two times a day (BID) | ORAL | 1 refills | Status: DC | PRN

## 2017-02-17 NOTE — Progress Notes (Signed)
BP 118/72   Pulse 61   Temp 98.1 F (36.7 C) (Oral)   Wt 162 lb (73.5 kg)   SpO2 98%   BMI 25.37 kg/m    CC: headache Subjective:    Patient ID: [name], male    DOB: 10/15/1985, 31 y.o.   MRN: [id number]  HPI: KASIN TONKINSON III is a 31 y.o. male presenting on 02/17/2017 for Headache (intermittent x 6-7 months... pt reports headache is throbbing and pressure build up in temples...visual changes.Marland KitchenMarland KitchenMarland KitchenSx worsened x 2 months... headache Sx worsen while working out....went to ENT and CT sinus was normal yesterday (in chart).... ) and Muscle Stiffness (in neck w/ radiation to shoulders)   6-7 mo h/o progressively worsening headaches described as dull ache along temple with radiation through entire head to bilateral shoulders, worsened with activity. Also with tightness in neck muscles and he thinks headaches stem from neck muscle pain. Some photophobia/phonophobia. Some vision change as headache worsens "I feel like I have to squint and have trouble focusing". Some tunnel vision. No nausea, not activity limiting. No visual aura.  Treating with OTC pain meds without improvement.   He hasn't lifted weights since headache started. When he tried lifting weights again, felt onset of neck stiffness then radiation of headache to temples.   Saw allergist Dr Dellis Anes s/p normal sinus CT for chronic allergic rhinitis. He was also diagnosed with migraines. Dymista is helpful. He is using individual components. Also started on xyzal. He will be started on allergy shots later this month.   Works night shift.   Pt does not have h/o migraines. Mother does have h/o migraines.   Relevant past medical, surgical, family and social history reviewed and updated as indicated. Interim medical history since our last visit reviewed. Allergies and medications reviewed and updated. Outpatient Medications Prior to Visit  Medication Sig Dispense Refill  . Azelastine HCl 137 MCG/SPRAY SOLN Place 2  sprays into the nose 2 (two) times daily. 30 mL 5  . diphenhydrAMINE (BENADRYL) 25 MG tablet Take 25 mg by mouth every 6 (six) hours as needed for itching.    . fluticasone (FLONASE) 50 MCG/ACT nasal spray Place 2 sprays into both nostrils 2 (two) times daily. 16 g 5  . levocetirizine (XYZAL) 5 MG tablet Take 1 tablet (5 mg total) by mouth every evening. 30 tablet 5  . montelukast (SINGULAIR) 10 MG tablet TAKE ONE TABLET BY MOUTH AT BEDTIME 90 tablet 1  . Multiple Vitamins-Minerals (MULTIVITAMIN ADULT PO) Take by mouth daily.    . fexofenadine (ALLEGRA) 180 MG tablet Take 180 mg by mouth daily.    . Olopatadine HCl (PATADAY) 0.2 % SOLN Place 1 drop into both eyes daily. 2.5 mL 5   No facility-administered medications prior to visit.      Per HPI unless specifically indicated in ROS section below Review of Systems     Objective:    BP 118/72   Pulse 61   Temp 98.1 F (36.7 C) (Oral)   Wt 162 lb (73.5 kg)   SpO2 98%   BMI 25.37 kg/m   Wt Readings from Last 3 Encounters:  02/17/17 162 lb (73.5 kg)  01/15/17 160 lb (72.6 kg)  01/01/17 165 lb (74.8 kg)    Physical Exam  Constitutional: He is oriented to person, place, and time. He appears well-developed and well-nourished. No distress.  HENT:  Head: Normocephalic and atraumatic.  Mouth/Throat: Oropharynx is clear and moist. No oropharyngeal exudate.  Eyes:  Conjunctivae and EOM are normal. Pupils are equal, round, and reactive to light. No scleral icterus.  Neck: Normal range of motion. Neck supple. No thyromegaly present.  Cardiovascular: Normal rate, regular rhythm, normal heart sounds and intact distal pulses.   No murmur heard. Pulmonary/Chest: Effort normal and breath sounds normal. No respiratory distress. He has no wheezes. He has no rales.  Musculoskeletal: He exhibits no edema.  No significant midline cervical spine tenderness + paracervical spinous mm tenderness and trapezius tightness L>R FROM at neck with some  discomfort  Lymphadenopathy:    He has no cervical adenopathy.  Neurological: He is alert and oriented to person, place, and time. He has normal strength. No cranial nerve deficit or sensory deficit. He displays a negative Romberg sign. Coordination and gait normal.  CN 2-12 intact FTN intact EOMI Neg pronator drift Neg spurling  Skin: Skin is warm and dry. No rash noted.  Psychiatric: He has a normal mood and affect.  Nursing note and vitals reviewed.     Assessment & Plan:   Problem List Items Addressed This Visit    TTH (tension-type headache) - Primary    I think Jonny RuizJohn is suffering from progressively worsening tension headaches with cervical neck muscle spasms. Will treat as such with flexeril (sedation precautions reviewed), heating pad, and stretching exercises provided today. If no improvement, will refer to PT.  Not quite consistent with migraine headaches. He doesn't have exertional headache component either.      Relevant Medications   cyclobenzaprine (FLEXERIL) 5 MG tablet       Follow up plan: Return if symptoms worsen or fail to improve.  Eustaquio BoydenJavier Zamari Vea, MD

## 2017-02-17 NOTE — Patient Instructions (Signed)
I think you have tension headache from tight cervical muscles. Treat with heating pad to neck, exercises provided today, and flexeril muscle relaxant. 1/2- 2 tablets as needed. Caution it may make you sleepy. If no better with this, let us know for physical therapy referral.   Tension Headache A tension headache is a feeling of pain, pressure, or aching that is often felt over the front and sides of the head. The pain can be dull, or it can feel tight (constricting). Tension headaches are not normally associated with nausea or vomiting, and they do not get worse with physical activity. Tension headaches can last from 30 minutes to several days. This is the most common type of headache. CAUSES The exact cause of this condition is not known. Tension headaches often begin after stress, anxiety, or depression. Other triggers may include:  Alcohol.  Too much caffeine, or caffeine withdrawal.  Respiratory infections, such as colds, flu, or sinus infections.  Dental problems or teeth clenching.  Fatigue.  Holding your head and neck in the same position for a long period of time, such as while using a computer.  Smoking. SYMPTOMS Symptoms of this condition include:  A feeling of pressure around the head.  Dull, aching head pain.  Pain felt over the front and sides of the head.  Tenderness in the muscles of the head, neck, and shoulders. DIAGNOSIS This condition may be diagnosed based on your symptoms and a physical exam. Tests may be done, such as a CT scan or an MRI of your head. These tests may be done if your symptoms are severe or unusual. TREATMENT This condition may be treated with lifestyle changes and medicines to help relieve symptoms. HOME CARE INSTRUCTIONS Managing Pain  Take over-the-counter and prescription medicines only as told by your health care provider.  Lie down in a dark, quiet room when you have a headache.  If directed, apply ice to the head and neck  area: ? Put ice in a plastic bag. ? Place a towel between your skin and the bag. ? Leave the ice on for 20 minutes, 2-3 times per day.  Use a heating pad or a hot shower to apply heat to the head and neck area as told by your health care provider. Eating and Drinking  Eat meals on a regular schedule.  Limit alcohol use.  Decrease your caffeine intake, or stop using caffeine. General Instructions  Keep all follow-up visits as told by your health care provider. This is important.  Keep a headache journal to help find out what may trigger your headaches. For example, write down: ? What you eat and drink. ? How much sleep you get. ? Any change to your diet or medicines.  Try massage or other relaxation techniques.  Limit stress.  Sit up straight, and avoid tensing your muscles.  Do not use tobacco products, including cigarettes, chewing tobacco, or e-cigarettes. If you need help quitting, ask your health care provider.  Exercise regularly as told by your health care provider.  Get 7-9 hours of sleep, or the amount recommended by your health care provider. SEEK MEDICAL CARE IF:  Your symptoms are not helped by medicine.  You have a headache that is different from what you normally experience.  You have nausea or you vomit.  You have a fever. SEEK IMMEDIATE MEDICAL CARE IF:  Your headache becomes severe.  You have repeated vomiting.  You have a stiff neck.  You have a loss of vision.  You have problems with speech.  You have pain in your eye or ear.  You have muscular weakness or loss of muscle control.  You lose your balance or you have trouble walking.  You feel faint or you pass out.  You have confusion. This information is not intended to replace advice given to you by your health care provider. Make sure you discuss any questions you have with your health care provider. Document Released: 08/04/2005 Document Revised: 04/25/2015 Document Reviewed:  11/27/2014 Elsevier Interactive Patient Education  2017 [employer]

## 2017-02-17 NOTE — Assessment & Plan Note (Signed)
I think Daniel RuizJohn is suffering from progressively worsening tension headaches with cervical neck muscle spasms. Will treat as such with flexeril (sedation precautions reviewed), heating pad, and stretching exercises provided today. If no improvement, will refer to PT.  Not quite consistent with migraine headaches. He doesn't have exertional headache component either.

## 2017-02-24 ENCOUNTER — Ambulatory Visit: Payer: 59

## 2017-02-25 NOTE — Progress Notes (Signed)
VIALS EXP 02-27-18

## 2017-03-02 DIAGNOSIS — J301 Allergic rhinitis due to pollen: Secondary | ICD-10-CM | POA: Diagnosis not present

## 2017-03-03 DIAGNOSIS — J3089 Other allergic rhinitis: Secondary | ICD-10-CM | POA: Diagnosis not present

## 2017-03-26 ENCOUNTER — Telehealth: Payer: Self-pay | Admitting: Allergy & Immunology

## 2017-03-26 MED ORDER — "NEEDLE (DISP) 27G X 1"" MISC": 0.5000 mL | 6 refills | Status: AC

## 2017-03-26 NOTE — Telephone Encounter (Signed)
Orders placed for needles for allergy shots.  Malachi BondsJoel Gissel Keilman, MD [facility] [place] [place]

## 2017-03-30 ENCOUNTER — Other Ambulatory Visit: Payer: Self-pay | Admitting: Family Medicine

## 2017-04-06 ENCOUNTER — Telehealth: Payer: Self-pay | Admitting: Allergy & Immunology

## 2017-04-06 MED ORDER — PENICILLIN V POTASSIUM 250 MG/5ML PO SOLR: 500.0000 mg | Freq: Four times a day (QID) | ORAL | 0 refills | Status: DC

## 2017-04-06 NOTE — Telephone Encounter (Signed)
Prescription sent in for penicillin VK for upcoming penicillin challenge.   Malachi Bonds, MD [facility] [place]

## 2017-04-07 ENCOUNTER — Other Ambulatory Visit: Payer: Self-pay

## 2017-04-07 MED ORDER — PENICILLIN V POTASSIUM 250 MG/5ML PO SOLR: 500.0000 mg | Freq: Four times a day (QID) | ORAL | 0 refills | Status: AC

## 2017-04-07 NOTE — Telephone Encounter (Signed)
Penicillin for in-office oral challenge resent to the correct pharmacy.

## 2017-04-13 ENCOUNTER — Encounter: Payer: Self-pay | Admitting: Allergy & Immunology

## 2017-04-13 ENCOUNTER — Ambulatory Visit (INDEPENDENT_AMBULATORY_CARE_PROVIDER_SITE_OTHER): Payer: 59 | Admitting: Allergy & Immunology

## 2017-04-13 VITALS — BP 128/82 | HR 76 | Resp 16

## 2017-04-13 DIAGNOSIS — Z880 Allergy status to penicillin: Secondary | ICD-10-CM

## 2017-04-13 NOTE — Patient Instructions (Addendum)
1. Penicillin allergy - You passed your penicillin challenge today. - We will remove this from your allergy list. - Call us with any concerns (or talk to [facility]!) - You can always text me if needed.  2. Return in about 1 year (around 04/13/2018).    Please inform us of any Emergency Department visits, hospitalizations, or changes in symptoms. Call us before going to the ED for breathing or allergy symptoms since we might be able to fit you in for a sick visit. Feel free to contact us anytime with any questions, problems, or concerns.  It was a pleasure to see you again today!   Websites that have reliable patient information: 1. American Academy of Asthma, Allergy, and Immunology: www.aaaai.org 2. Food Allergy Research and Education (FARE): foodallergy.org 3. Mothers of Asthmatics: http://www.asthmacommunitynetwork.org 4. American College of Allergy, Asthma, and Immunology: www.acaai.org   Election Day is coming up on Tuesday, November 6th! Make your voice heard! Register to vote at vote.org!

## 2017-04-13 NOTE — Progress Notes (Signed)
FOLLOW UP  Date of Service/Encounter:  04/13/17   Assessment:   Non-seasonal allergic rhinitis due to pollen (trees, weeds, ragweed, grasses, molds, dust mites, cat)  Headaches - likely migraine  Penicillin allergy - passed challenge today  Plan/Recommendations:   1. Penicillin allergy - You passed your penicillin challenge today. - We will remove this from your allergy list. - Call us with any concerns.  2. Return in about 1 year (around 04/13/2018).  Subjective:   Daniel Cooper is a 31 y.o. male presenting today for follow up of  Chief Complaint  Patient presents with  . Food/Drug Challenge    penicillin    Daniel Cooper has a history of the following: Patient Active Problem List   Diagnosis Date Noted  . TTH (tension-type headache) 02/17/2017  . 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 Recurrent sinus infections 01/01/2017  . Frequent headaches 01/01/2017  . Health maintenance examination 01/23/2015  . Skin rash 01/23/2015  . Non-traumatic rhabdomyolysis 09/07/2014  . Non-seasonal allergic rhinitis due to pollen 11/28/2010    History obtained from: chart review and patient and patient's girlfriend.  Daniel Cooper's Primary Care Provider is Daniel Boyden, MD.     Daniel Cooper is a 31 y.o. male presenting for a penicillin challenge. He was last seen in May 2018 for his first visit. At that time, he had allergy testing that showed sensitization to trees, grasses, weeds, molds, dust mite, and cat. We started him on Xyzal 5 mg daily as well as Dymista 2 sprays per nostril 1-2 times daily. He was also started on allergy eyedrops. We did recommend that he start allergy injections and he is leaning towards doing this at an outside facility. He has a history of a penicillin reaction from when he was a toddler. He developed a rash that cleared with Benadryl. He has not had penicillins in > 20 years, therefore we felt that he was low risk and recommended that he  undergo a challenge.   Since the last visit, he has done well. He did undergo a sinus CT that was negative. Therefore I recommended that he talk to his PCP about starting treatment for migraines. His PCP felt that musculoskeletal pain might be contributing to symptoms, but muscle relaxants have not helped much at all. He also endorses continued congestion despite nasal hygiene. Therefore he is leaning towards starting allergy shots to see how these go before continuing with a migraine workup.   Otherwise, there have been no changes to his past medical history, surgical history, family history, or social history.    Review of Systems: a 14-point review of systems is pertinent for what is mentioned in HPI.  Otherwise, all other systems were negative. Constitutional: negative other than that listed in the HPI Eyes: negative other than that listed in the HPI Ears, nose, mouth, throat, and face: negative other than that listed in the HPI Respiratory: negative other than that listed in the HPI Cardiovascular: negative other than that listed in the HPI Gastrointestinal: negative other than that listed in the HPI Genitourinary: negative other than that listed in the HPI Integument: negative other than that listed in the HPI Hematologic: negative other than that listed in the HPI Musculoskeletal: negative other than that listed in the HPI Neurological: negative other than that listed in the HPI Allergy/Immunologic: negative other than that listed in the HPI    Objective:   Blood pressure 128/82, pulse 76, resp. rate 16. There is no height  or weight on file to calculate BMI.   Physical Exam:  General: Alert, interactive, in no acute distress. Pleasant well built male.  Eyes: No conjunctival injection present on the right, No conjunctival injection present on the left, PERRL bilaterally, No discharge on the right, No discharge on the left and No Horner-Trantas dots present Ears: Right TM  pearly gray with normal light reflex, Left TM pearly gray with normal light reflex, Right TM intact without perforation and Left TM intact without perforation.  Nose/Throat: External nose within normal limits and septum midline, turbinates edematous without discharge, post-pharynx mildly erythematous without cobblestoning in the posterior oropharynx. Tonsils 2+ without exudates Neck: Supple without thyromegaly. Lungs: Clear to auscultation without wheezing, rhonchi or rales. No increased work of breathing. CV: Normal S1/S2, no murmurs. Capillary refill <2 seconds.  Skin: Warm and dry, without lesions or rashes. Neuro:   Grossly intact. No focal deficits appreciated. Responsive to questions.   Diagnostic studies:    Open graded penicillin oral challenge: The patient was able to tolerate the challenge today without adverse signs or symptoms. Vital signs were stable throughout the challenge and observation period. He received 41mL of the penicillin (50mg ) followed by an observation period of 30 minutes. Vitals and physical exam remained stable. Then he received 51mL (450mg ) of the penicillin followed by a 60 minute observation period. Again, vitals and physical exam remained stable.      Malachi Bonds, MD FAAAAI [facility] [place]

## 2017-04-30 ENCOUNTER — Other Ambulatory Visit: Payer: Self-pay | Admitting: Family Medicine

## 2017-05-07 ENCOUNTER — Other Ambulatory Visit: Payer: Self-pay | Admitting: Family Medicine

## 2017-06-27 ENCOUNTER — Other Ambulatory Visit: Payer: Self-pay | Admitting: Allergy & Immunology

## 2017-07-13 ENCOUNTER — Other Ambulatory Visit: Payer: Self-pay | Admitting: *Deleted

## 2017-07-13 MED ORDER — LEVOCETIRIZINE DIHYDROCHLORIDE 5 MG PO TABS: 5.0000 mg | ORAL_TABLET | Freq: Every evening | ORAL | 2 refills | Status: DC

## 2017-08-07 ENCOUNTER — Telehealth: Payer: Self-pay | Admitting: Allergy & Immunology

## 2017-08-07 ENCOUNTER — Telehealth: Payer: Self-pay

## 2017-08-07 DIAGNOSIS — R51 Headache: Principal | ICD-10-CM

## 2017-08-07 DIAGNOSIS — R519 Headache, unspecified: Secondary | ICD-10-CM

## 2017-08-07 MED ORDER — SUMATRIPTAN SUCCINATE 50 MG PO TABS: ORAL_TABLET | ORAL | 0 refills | Status: DC

## 2017-08-07 NOTE — Telephone Encounter (Signed)
I talked to Daniel Cooper, who continues to have sinus pressure, headaches, and neck pain. This is a chronic problem and he has had no fevers with this or rashes. PCP has recommended physical therapy, which has not provided much relief. Of note, he has not started his allergy injections yet but is planning to shortly. We will approach this is a logical way, trying migraine medications first and then starting a prednisone taper if there is no improvement with the migraine medication.  Imitrex 50mg  sent in (dispense 10 pills). A prednisone taper pack provided in clinic.   Patient in agreement with the plan.  Daniel BondsJoel Gallagher, MD [facility] [place]

## 2017-08-07 NOTE — Telephone Encounter (Signed)
Pt called requesting a refill on allergy syringes. [facility] with [facility].

## 2017-08-20 NOTE — Telephone Encounter (Signed)
I talked to the patient and the Imitrex was not successful. He will occasionally feel better when he takes all of his allergy medications as prescribed, but at other times he continues to have the headaches despite this. Therefore I will place a referral for Neurology.  Malachi BondsJoel Elmer Merwin, MD [facility] [place]

## 2017-08-20 NOTE — Addendum Note (Signed)
Addended by: Alfonse SpruceGALLAGHER, Markel Kurtenbach LOUIS on: 08/20/2017 10:04 AM   Modules accepted: Orders

## 2017-08-20 NOTE — Telephone Encounter (Signed)
Placed in [facility]

## 2017-08-21 ENCOUNTER — Encounter: Payer: Self-pay | Admitting: Neurology

## 2017-08-31 DIAGNOSIS — G43009 Migraine without aura, not intractable, without status migrainosus: Secondary | ICD-10-CM | POA: Diagnosis not present

## 2017-08-31 DIAGNOSIS — M5481 Occipital neuralgia: Secondary | ICD-10-CM | POA: Diagnosis not present

## 2017-08-31 DIAGNOSIS — R51 Headache: Secondary | ICD-10-CM | POA: Diagnosis not present

## 2017-08-31 NOTE — Addendum Note (Signed)
Addended by: Alfonse SpruceGALLAGHER, Alysa Duca LOUIS on: 08/31/2017 08:45 AM   Modules accepted: Orders

## 2017-08-31 NOTE — Telephone Encounter (Signed)
Labs have been mailed out to patient's home address from the [place] office.

## 2017-08-31 NOTE — Telephone Encounter (Addendum)
I talked to Daniel Cooper, who reported that the prednisone burst helped with his headaches and muscle stiffness, but he has noticed worsening of these symptoms with the weaning down of the dose. I talked to him about the etiologies of his symptoms, and had a couple of thoughts:  1. This is still an allergic process and we need to give the shots more time to work (he is currently at 0.27m of the [employer].  or   2. This a rheumatologic process such as giant cell arteritis or polymyositis (both unlikely with his gender and age), who symptoms were alleviated with the prednisone.  Given this information, he decided to undergo some screening labs for rheumatologic etiologies of his symptoms. Orders placed for a CRP, ESR, CK, and ANA. We are getting a CK due to a history of rhabdomyolysis in the past.    JSalvatore Marvel MD [facility]

## 2017-09-03 DIAGNOSIS — R51 Headache: Secondary | ICD-10-CM | POA: Diagnosis not present

## 2017-09-04 LAB — C-REACTIVE PROTEIN: CRP: 0.3 mg/L (ref 0.0–4.9)

## 2017-09-04 LAB — ANA: Anti Nuclear Antibody(ANA): NEGATIVE

## 2017-09-04 LAB — SEDIMENTATION RATE: Sed Rate: 2 mm/hr (ref 0–15)

## 2017-09-04 LAB — CK: Total CK: 107 U/L (ref 24–204)

## 2017-09-14 ENCOUNTER — Ambulatory Visit: Payer: 59 | Admitting: Allergy & Immunology

## 2017-09-14 ENCOUNTER — Encounter: Payer: Self-pay | Admitting: Allergy & Immunology

## 2017-09-14 VITALS — BP 116/72 | HR 65 | Resp 17

## 2017-09-14 DIAGNOSIS — J302 Other seasonal allergic rhinitis: Secondary | ICD-10-CM | POA: Insufficient documentation

## 2017-09-14 DIAGNOSIS — J3089 Other allergic rhinitis: Secondary | ICD-10-CM | POA: Diagnosis not present

## 2017-09-14 DIAGNOSIS — G43909 Migraine, unspecified, not intractable, without status migrainosus: Secondary | ICD-10-CM | POA: Insufficient documentation

## 2017-09-14 DIAGNOSIS — G43809 Other migraine, not intractable, without status migrainosus: Secondary | ICD-10-CM | POA: Diagnosis not present

## 2017-09-14 DIAGNOSIS — K219 Gastro-esophageal reflux disease without esophagitis: Secondary | ICD-10-CM | POA: Insufficient documentation

## 2017-09-14 DIAGNOSIS — R0981 Nasal congestion: Secondary | ICD-10-CM

## 2017-09-14 MED ORDER — LEVOCETIRIZINE DIHYDROCHLORIDE 5 MG PO TABS: 5.0000 mg | ORAL_TABLET | Freq: Every evening | ORAL | 5 refills | Status: DC

## 2017-09-14 MED ORDER — MONTELUKAST SODIUM 10 MG PO TABS: 10.0000 mg | ORAL_TABLET | Freq: Every day | ORAL | 5 refills | Status: DC

## 2017-09-14 NOTE — Patient Instructions (Addendum)
1. Chronic allergic rhinitis (trees, weeds, grasses, molds, dust mites and cat) - Continue with Xyzal (levocetirizine) 5mg  tablet once daily, Singulair 10mg  once daily, Dymista (fluticasone/azelastine) two sprays per nostril 1-2 times daily as needed and Pazeo (olopatadine) one drop per eye daily as needed  - We can refer you to ENT for evaluation of the nasal congestion.  - Continue with allergy shots at the same schedule.    2. Migraines - I think Dr. Alton RevereFeraru is on the right track regarding treatment of your headaches. - I am optimistic that the Topamax will provide better coverage of your headaches.  3. Chest tightness - I think that this is related to reflux, so try using the Dexilant samples one capsule daily for a couple of weeks. - Text me with an update.   4. Return in about 1 year (around 09/14/2018).  Please inform us of any Emergency Department visits, hospitalizations, or changes in symptoms. Call us before going to the ED for breathing or allergy symptoms since we might be able to fit you in for a sick visit. Feel free to contact us anytime with any questions, problems, or concerns.  It was a pleasure to see you again today! Happy New Year!   Websites that have reliable patient information: 1. American Academy of Asthma, Allergy, and Immunology: www.aaaai.org 2. Food Allergy Research and Education (FARE): foodallergy.org 3. Mothers of Asthmatics: http://www.asthmacommunitynetwork.org 4. [employer] College of Allergy, Asthma, and Immunology: MissingWeapons.cawww.acaai.org    MRI BRAIN WITHOUT CONTRAST, 09/07/2017 10:15 AM   TECHNIQUE: Multiplanar, multi-sequence MR imaging of the entire brain was performed without contrast.   FINDINGS:  Calvarium/skull base: No focal marrow replacing lesion suggestive of neoplasm. Orbits: No focal mass. Paranasal sinuses: No air-fluid levels or substantial mucosal disease. Brain: No evidence of acute abnormality. No significant white matter disease. No  evidence of acute infarct. No mass effect, acute hemorrhage, or hydrocephalus. Grossly normal flow-related signal in the major intracranial arteries and dural sinuses.     Additional comments: None.   CONCLUSION: Normal unenhanced MRI of the brain.

## 2017-09-14 NOTE — Progress Notes (Signed)
FOLLOW UP  Date of Service/Encounter:  09/14/17   Assessment:   Seasonal and perennial allergic rhinitis (trees, weeds, grasses, molds, dust mites and cat)  Chronic migraines - on metoprolol without improvement  Gastroesophageal reflux disease - not currently medicated  Nasal congestion - with some improvement with allergen immunotherapy   Plan/Recommendations:   1. Chronic allergic rhinitis (trees, weeds, grasses, molds, dust mites and cat) - Continue with Xyzal (levocetirizine) 5mg  tablet once daily, Singulair 10mg  once daily, Dymista (fluticasone/azelastine) two sprays per nostril 1-2 times daily as needed and Pazeo (olopatadine) one drop per eye daily as needed  - We can refer you to ENT for evaluation of the nasal congestion.  - Continue with allergy shots at the same schedule.    2. Migraines - I think Dr. Alton Cooper is on the right track regarding treatment of your headaches. - I am optimistic that the Topamax will provide better coverage of your headaches.  3. Chest tightness - I think that this is related to reflux, so try using the Dexilant samples one capsule daily for a couple of weeks. - Text me with an update.  - If this is helping, we will send in prescription for a PPI such as omeprazole since this will be a cheaper alternative.   4. Return in about 1 year (around 09/14/2018).   Subjective:   Daniel Cooper is a 32 y.o. male presenting today for follow up of No chief complaint on file.   Daniel Cooper has a history of the following: Patient Active Problem List   Diagnosis Date Noted  . Seasonal and perennial allergic rhinitis 09/14/2017  . Migraine 09/14/2017  . Gastroesophageal reflux disease 09/14/2017  . TTH (tension-type headache) 02/17/2017  . Recurrent sinus infections 01/01/2017  . Frequent headaches 01/01/2017  . Health maintenance examination 01/23/2015  . Skin rash 01/23/2015  . Non-traumatic rhabdomyolysis 09/07/2014  .  Non-seasonal allergic rhinitis due to pollen 11/28/2010    History obtained from: chart review and patient and his girlfriend.  Daniel Cooper's Primary Care Provider is Daniel Boyden, MD.     Daniel Cooper is a 32 y.o. male presenting for a follow up visit. He was last seen in May 2018 for his first visit andi n August 2018 for a penicillin challenge. At the first visit, he had allergy testing that showed sensitization to trees, grasses, weeds, molds, dust mite, and cat. We started him on Xyzal 5 mg daily as well as Dymista 2 sprays per nostril 1-2 times daily. He was also started on allergy eyedrops. We did recommend that he start allergy injections. He has a history of a penicillin reaction from when he was a toddler. He developed a rash that cleared with Benadryl. He has not had penicillins in > 20 years, therefore we felt that he was low risk and recommended that he undergo a challenge, which he did pass. He was also endorsing headaches, therefore we obtained a sinus CT which was clear. We felt that he had migraines, and we attempted the use of Immitrex. This was unsuccessful and we referred him to Neurology instead (Dr. Rosario Cooper at [facility]).   Since the last visit, he has mostly done well. He has not been extremely compliant with his allergy injections, but he is making some headway. He is using his fluticasone and azelastine nasal sprays. He did feel 85% better on the recent prednisone burst. But symptoms returned when the prednisone was weaned. He is on  the shots, which seem to be providing some relief. He does endorse intermittent drainage, which has improved overall since starting the allergy injections. Of note, he is also endorsing some chest tightness. He has a very remote history of asthma as a child, but he also has a history of reflux, for which he does not take anything at this time.   He has been started on metoprolol which did improve somewhat but did not alleviate them  completely. The headaches are not painful but they are still present. It feels like there is a heavy weight on his shoulders and vision changes. He also received trigger point injections in his occipital region which did help initially. They are thinking are thinking of changing him to topiramate instead. He does have an appointment on February 11th. An MRI was done last week which was normal. He has now had a normal sinus CT and normal brain MRI.   Daniel Cooper is on allergen immunotherapy. He receives two injections. Immunotherapy script #1 contains molds. He currently receives 0.1550mL of the BLUE vial (1/100,000). Immunotherapy script #2 contains trees, weeds, grasses, molds, dust mites and cat. He currently receives 0.3550mL of the BLUE vial (1/100,000). He started shots November of 2018 and not yet reached maintenance. He receives his injections at an outside facility with our dosing parameters.   Otherwise, there have been no changes to his past medical history, surgical history, family history, or social history.  MRI BRAIN WITHOUT CONTRAST, 09/07/2017 10:15 AM   TECHNIQUE: Multiplanar, multi-sequence MR imaging of the entire brain was performed without contrast.   FINDINGS:  Calvarium/skull base: No focal marrow replacing lesion suggestive of neoplasm. Orbits: No focal mass. Paranasal sinuses: No air-fluid levels or substantial mucosal disease. Brain: No evidence of acute abnormality. No significant white matter disease. No evidence of acute infarct. No mass effect, acute hemorrhage, or hydrocephalus. Grossly normal flow-related signal in the major intracranial arteries and dural sinuses.     Additional comments: None.   CONCLUSION: Normal unenhanced MRI of the brain.       Review of Systems: a 14-point review of systems is pertinent for what is mentioned in HPI.  Otherwise, all other systems were negative. Constitutional: negative other than that listed in the HPI Eyes: negative other than  that listed in the HPI Ears, nose, mouth, throat, and face: negative other than that listed in the HPI Respiratory: negative other than that listed in the HPI Cardiovascular: negative other than that listed in the HPI Gastrointestinal: negative other than that listed in the HPI Genitourinary: negative other than that listed in the HPI Integument: negative other than that listed in the HPI Hematologic: negative other than that listed in the HPI Musculoskeletal: negative other than that listed in the HPI Neurological: negative other than that listed in the HPI Allergy/Immunologic: negative other than that listed in the HPI    Objective:   Blood pressure 116/72, pulse 65, resp. rate 17, SpO2 97 %. There is no height or weight on file to calculate BMI.   Physical Exam:  General: Alert, interactive, in no acute distress. Pleasant, bearded male.  Eyes: No conjunctival injection bilaterally, no discharge on the right, no discharge on the left and no Horner-Trantas dots present. PERRL bilaterally. EOMI without pain. No photophobia.  Ears: Right TM pearly gray with normal light reflex, Left TM pearly gray with normal light reflex, Right TM intact without perforation and Left TM intact without perforation.  Nose/Throat: External nose within normal limits and septum  midline. Turbinates edematous and pale with clear discharge. Posterior oropharynx markedly erythematous with cobblestoning in the posterior oropharynx. Tonsils 2+ without exudates.  Tongue without thrush.  Adenopathy: no enlarged lymph nodes appreciated in the anterior cervical, occipital, axillary, epitrochlear, inguinal, or popliteal regions. Lungs: Clear to auscultation without wheezing, rhonchi or rales. No increased work of breathing. CV: Normal S1/S2. No murmurs. Capillary refill <2 seconds.  Skin: Warm and dry, without lesions or rashes. Neuro:   Grossly intact. No focal deficits appreciated. Responsive to  questions.  Diagnostic studies: none     Malachi Bonds, MD [facility]

## 2017-09-14 NOTE — Progress Notes (Signed)
[facility] Patient has an appt next Tuesday at 10 with the PA Patient has been contacted about the appt

## 2017-09-28 DIAGNOSIS — M542 Cervicalgia: Secondary | ICD-10-CM | POA: Diagnosis not present

## 2017-09-28 DIAGNOSIS — R51 Headache: Secondary | ICD-10-CM | POA: Diagnosis not present

## 2017-09-28 DIAGNOSIS — G43009 Migraine without aura, not intractable, without status migrainosus: Secondary | ICD-10-CM | POA: Diagnosis not present

## 2017-09-29 DIAGNOSIS — J343 Hypertrophy of nasal turbinates: Secondary | ICD-10-CM | POA: Diagnosis not present

## 2017-09-29 DIAGNOSIS — J320 Chronic maxillary sinusitis: Secondary | ICD-10-CM | POA: Diagnosis not present

## 2017-09-29 DIAGNOSIS — R51 Headache: Secondary | ICD-10-CM | POA: Diagnosis not present

## 2017-10-06 DIAGNOSIS — M5412 Radiculopathy, cervical region: Secondary | ICD-10-CM | POA: Diagnosis not present

## 2017-10-06 DIAGNOSIS — M542 Cervicalgia: Secondary | ICD-10-CM | POA: Diagnosis not present

## 2017-11-02 DIAGNOSIS — M542 Cervicalgia: Secondary | ICD-10-CM | POA: Diagnosis not present

## 2017-11-02 DIAGNOSIS — G43009 Migraine without aura, not intractable, without status migrainosus: Secondary | ICD-10-CM | POA: Diagnosis not present

## 2017-11-02 DIAGNOSIS — R51 Headache: Secondary | ICD-10-CM | POA: Diagnosis not present

## 2017-11-09 ENCOUNTER — Ambulatory Visit: Payer: 59 | Admitting: Internal Medicine

## 2017-11-09 ENCOUNTER — Ambulatory Visit: Payer: Self-pay | Admitting: *Deleted

## 2017-11-09 ENCOUNTER — Encounter: Payer: Self-pay | Admitting: Internal Medicine

## 2017-11-09 VITALS — BP 118/76 | HR 60 | Temp 98.1°F | Wt 166.0 lb

## 2017-11-09 DIAGNOSIS — Z23 Encounter for immunization: Secondary | ICD-10-CM | POA: Diagnosis not present

## 2017-11-09 DIAGNOSIS — S50811A Abrasion of right forearm, initial encounter: Secondary | ICD-10-CM

## 2017-11-09 NOTE — Telephone Encounter (Signed)
I returned his call.   He was working with Psychologist, counsellingbarbed wire fencing yesterday and cut the inner part of his right forearm about 10 inches long with a rusty barb that had been exposed to water.   It is not deep and did not bleed much.   It was about 3 hours later before he could wash it off with soap and water then Dawn dishwashing liquid.   He put the [employer] brand of Neosporin on it and continues to do this.   Denies pain, redness, swelling, etc.      His last tetanus shot was in 2010.  I made an appt with Nicki Reaperegina Baity, FNP for this morning at 9:15.   I instructed him to be at the office by 9:00 to be registered.   He reassured me he could make it.   Reason for Disposition . [1] Last tetanus shot > 5 years ago AND [2] DIRTY cut  Answer Assessment - Initial Assessment Questions 1. APPEARANCE of INJURY: "What does the injury look like?"      I cut myself on a barbed wire fence yesterday.   It's 10 inches long and not very deep.  I cleaned it with Dawn and am putting ointment on it.  It's on my forarm.   Equit triple antibiotic (Neosporin).    2. SIZE: "How large is the cut?"      10 inches long.    My right forearm.   It's on the inner forearm. 3. BLEEDING: "Is it bleeding now?" If so, ask: "Is it difficult to stop?"      It bled good yesterday.   I let it bleed until it stopped.  It was not much bleeding at all.    4. LOCATION: "Where is the injury located?"      Inner right forearm. 5. ONSET: "How long ago did the injury occur?"      Yesterday 6. 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 7. TETANUS: "When was the last tetanus booster?"     2010     8. PREGNANCY: "Is there any chance you are pregnant?" "When was your last menstrual period?"     N/A  Protocols used: CUTS AND LACERATIONS-A-AH

## 2017-11-09 NOTE — Progress Notes (Signed)
Subjective:    Patient ID: Daniel Cooper, male    DOB: 12-04-1985, 32 y.o.   MRN: [id number]  HPI  Pt presents to the clinic today with c/o a laceration to his right forearm. He reports he cut this on barbwire fence yesterday. He cleansed the area with soap and water. He put antibacterial ointment on the area. His last tetanus was in 2010. He would like a Tdap today.  Review of Systems      Past Medical History:  Diagnosis Date  . History of rhabdomyolysis 08/2014   after intense workout  . Seasonal allergies   . VSD (ventricular septal defect)    told closed    Current Outpatient Medications  Medication Sig Dispense Refill  . Azelastine HCl 137 MCG/SPRAY SOLN Place 2 sprays into the nose 2 (two) times daily. 30 mL 5  . diphenhydrAMINE (BENADRYL) 25 MG tablet Take 25 mg by mouth every 6 (six) hours as needed for itching.    . fluticasone (FLONASE) 50 MCG/ACT nasal spray Place 2 sprays into both nostrils 2 (two) times daily. 16 g 5  . levocetirizine (XYZAL) 5 MG tablet Take 1 tablet (5 mg total) by mouth every evening. 30 tablet 5  . metoprolol succinate (TOPROL-XL) 25 MG 24 hr tablet Take by mouth.    . montelukast (SINGULAIR) 10 MG tablet Take 1 tablet (10 mg total) by mouth at bedtime. 30 tablet 5  . Multiple Vitamins-Minerals (MULTIVITAMIN ADULT PO) Take by mouth daily.    . Needle, Disp, 27G X 1" MISC 0.5 mLs by Does not apply route once a week. 50 each 6   No current facility-administered medications for this visit.     No Known Allergies  Family History  Problem Relation Age of Onset  . Stroke Maternal Uncle 40  . Prostate cancer Paternal Grandfather 1760  . Coronary artery disease Maternal Grandmother        CABG; +smoker  . Hypertension Unknown        some in family    Social History   Socioeconomic History  . Marital status: Single    Spouse name: Not on file  . Number of children: Not on file  . Years of education: Not on file  . Highest education  level: Not on file  Occupational History  . Occupation: Sports administratorMechanic    Employer: [employer]  Social Needs  . Financial resource strain: Not on file  . Food insecurity:    Worry: Not on file    Inability: Not on file  . Transportation needs:    Medical: Not on file    Non-medical: Not on file  Tobacco Use  . Smoking status: Never Smoker  . Smokeless tobacco: Never Used  Substance and Sexual Activity  . Alcohol use: Yes    Comment: Occasional  . Drug use: No  . Sexual activity: Not on file  Lifestyle  . Physical activity:    Days per week: Not on file    Minutes per session: Not on file  . Stress: Not on file  Relationships  . Social connections:    Talks on phone: Not on file    Gets together: Not on file    Attends religious service: Not on file    Active member of club or organization: Not on file    Attends meetings of clubs or organizations: Not on file    Relationship status: Not on file  . Intimate partner violence:  Fear of current or ex partner: Not on file    Emotionally abused: Not on file    Physically abused: Not on file    Forced sexual activity: Not on file  Other Topics Concern  . Not on file  Social History Narrative   Caffeine: 4 cans of soda   Divorced   Lives with friends, 3 dogs   Monogamous   Curator at [employer], 3rd shift      Constitutional: Denies fever, malaise, fatigue, headache or abrupt weight changes.  Skin: Pt reports abrasion to right forearm. Denies rashes, lesions or ulcercations.    No other specific complaints in a complete review of systems (except as listed in HPI above).  Objective:   Physical Exam   BP 118/76   Pulse 60   Temp 98.1 F (36.7 C) (Oral)   Wt 166 lb (75.3 kg)   SpO2 98%   BMI 26.00 kg/m  Wt Readings from Last 3 Encounters:  11/09/17 166 lb (75.3 kg)  02/17/17 162 lb (73.5 kg)  01/15/17 160 lb (72.6 kg)    General: Appears his stated age, well developed, well nourished in  NAD. Skin: 16 cm abrasion to right forearm, scabbed, appears to be healing normally.    BMET    Component Value Date/Time   NA 137 01/01/2017 0925   K 4.4 01/01/2017 0925   CL 103 01/01/2017 0925   CO2 29 01/01/2017 0925   GLUCOSE 102 (H) 01/01/2017 0925   BUN 20 01/01/2017 0925   CREATININE 1.18 01/01/2017 0925   CALCIUM 9.5 01/01/2017 0925   GFRNONAA >90 09/05/2014 0415   GFRAA >90 09/05/2014 0415    Lipid Panel     Component Value Date/Time   CHOL 114 09/03/2014 0351   TRIG 41 09/03/2014 0351   HDL 37 (L) 09/03/2014 0351   CHOLHDL 3.1 09/03/2014 0351   VLDL 8 09/03/2014 0351   LDLCALC 69 09/03/2014 0351    CBC    Component Value Date/Time   WBC 5.0 01/01/2017 0925   RBC 5.40 01/01/2017 0925   HGB 16.2 01/01/2017 0925   HCT 45.9 01/01/2017 0925   PLT 199.0 01/01/2017 0925   MCV 85.0 01/01/2017 0925   MCH 29.3 09/03/2014 0351   MCHC 35.3 01/01/2017 0925   RDW 13.2 01/01/2017 0925   LYMPHSABS 1.7 01/01/2017 0925   MONOABS 0.3 01/01/2017 0925   EOSABS 0.1 01/01/2017 0925   BASOSABS 0.0 01/01/2017 0925    Hgb A1C Lab Results  Component Value Date   HGBA1C 5.4 01/01/2017             Assessment & Plan:   Abrasion to Right Forearm:  Continue to cleanse with soap and water Apply Neosporin BID Tdap today  RTC as needed or if symptoms persist or worsen Nicki Reaper, NP

## 2017-11-09 NOTE — Patient Instructions (Signed)
Abrasion An abrasion is a cut or scrape on the surface of your skin. An abrasion does not go through all of the layers of your skin. It is important to take good care of your abrasion to prevent infection. Follow these instructions at home: Medicines  Take or apply medicines only as told by your doctor.  If you were prescribed an antibiotic ointment, finish all of it even if you start to feel better. Wound care  Clean the wound with mild soap and water 2-3 times per day or as told by your doctor. Pat your wound dry with a clean towel. Do not rub it.  There are many ways to close and cover a wound. Follow instructions from your doctor about: ? How to take care of your wound. ? When and how you should change your bandage (dressing). ? When and how you should take off your dressing.  Check your wound every day for signs of infection. Watch for: ? Redness, swelling, or pain. ? Fluid, blood, or pus. General instructions  Keep the dressing dry as told by your doctor. Do not take baths, swim, use a hot tub, or do anything that would put your wound underwater until your doctor says it is okay.  If there is swelling, raise (elevate) the injured area above the level of your heart while you are sitting or lying down.  Keep all follow-up visits as told by your doctor. This is important. Contact a doctor if:  You were given a tetanus shot and you have any of these where the needle went in: ? Swelling. ? Very bad pain. ? Redness. ? Bleeding.  Medicine does not help your pain.  You have any of these at the site of the wound: ? More redness. ? More swelling. ? More pain. Get help right away if:  You have a red streak going away from your wound.  You have a fever.  You have fluid, blood, or pus coming from your wound.  There is a bad smell coming from your wound. This information is not intended to replace advice given to you by your health care provider. Make sure you discuss any  questions you have with your health care provider. Document Released: 01/21/2008 Document Revised: 01/10/2016 Document Reviewed: 08/02/2014 Elsevier Interactive Patient Education  2018 Elsevier Inc.  

## 2017-11-09 NOTE — Addendum Note (Signed)
Addended by: Roena MaladyEVONTENNO, MELANIE Y on: 11/09/2017 10:29 AM   Modules accepted: Orders

## 2017-12-01 ENCOUNTER — Ambulatory Visit: Payer: 59 | Admitting: Neurology

## 2017-12-14 ENCOUNTER — Encounter: Payer: Self-pay | Admitting: Neurology

## 2018-02-03 NOTE — Progress Notes (Signed)
VIALS EXP 02-04-19 

## 2018-02-04 DIAGNOSIS — J301 Allergic rhinitis due to pollen: Secondary | ICD-10-CM | POA: Diagnosis not present

## 2018-03-22 ENCOUNTER — Encounter

## 2018-03-22 ENCOUNTER — Ambulatory Visit: Payer: 59 | Admitting: Neurology

## 2018-12-02 NOTE — Progress Notes (Addendum)
VIALS EXP 12-02-2019. Vials not made until 12-19-2019

## 2018-12-20 DIAGNOSIS — J301 Allergic rhinitis due to pollen: Secondary | ICD-10-CM | POA: Diagnosis not present

## 2019-11-14 NOTE — Progress Notes (Signed)
VIALS EXP 11-14-20 

## 2019-11-15 DIAGNOSIS — J3089 Other allergic rhinitis: Secondary | ICD-10-CM

## 2019-11-25 ENCOUNTER — Telehealth: Payer: Self-pay

## 2019-11-25 NOTE — Telephone Encounter (Signed)
Patient is restarting immunotherapy pending videovisit with MD. Will restart red vials per MD instructions.

## 2019-11-28 ENCOUNTER — Telehealth: Payer: Self-pay | Admitting: Allergy & Immunology

## 2019-11-28 NOTE — Telephone Encounter (Signed)
Called patient to schedule follow up office visit for insurance purposes regarding injections. Unable to leave message-mailbox full.

## 2019-11-30 NOTE — Telephone Encounter (Signed)
Appointment to discuss restarting injections has been scheduled.

## 2019-12-09 ENCOUNTER — Ambulatory Visit (INDEPENDENT_AMBULATORY_CARE_PROVIDER_SITE_OTHER): Payer: Managed Care, Other (non HMO) | Admitting: Allergy & Immunology

## 2019-12-09 ENCOUNTER — Encounter: Payer: Self-pay | Admitting: Allergy & Immunology

## 2019-12-09 ENCOUNTER — Other Ambulatory Visit: Payer: Self-pay

## 2019-12-09 DIAGNOSIS — J3089 Other allergic rhinitis: Secondary | ICD-10-CM | POA: Diagnosis not present

## 2019-12-09 DIAGNOSIS — J302 Other seasonal allergic rhinitis: Secondary | ICD-10-CM

## 2019-12-09 NOTE — Progress Notes (Signed)
RE: Daniel Cooper MRN: [id number] DOB: November 09, 1985 Date of Telemedicine Visit: 12/09/2019  Referring provider: Eustaquio Boyden, MD Primary care provider: Eustaquio Boyden, MD  Chief Complaint: Allergic Rhinitis    Telemedicine Follow Up Visit via Telephone: I connected with Daniel Cooper for a follow up on 12/09/19 by telephone and verified that I am speaking with the correct person using two identifiers.   I discussed the limitations, risks, security and privacy concerns of performing an evaluation and management service by telephone and the availability of in person appointments. I also discussed with the patient that there may be a patient responsible charge related to this service. The patient expressed understanding and agreed to proceed.  Patient is at home accompanied by his girlfriend  who provided/contributed to the history.  Provider is at the office.  Visit start time: 7:55 AM Visit end time: 8:14 AM Insurance consent/check in by: Dr. Dellis Anes Medical consent and medical assistant/nurse: Dr. Dellis Anes  History of Present Illness:  He is a 34 y.o. male, who is being followed for seasonal and perennial allergic rhinitis as well as migraines. His previous allergy office visit was in January 2019  with myself.  At that time, he was doing well.  We continued him on all of his allergy medications including the Xyzal, Singulair, Dymista, and Pazeo.  We also continued with allergy shots.  He was doing well with Topamax for treatment of his migraines.  He was also having chest pain which I felt was reflux related.  Since last visit, he has done well.  He was uninsured for period of 2 to 3 months earlier this year, which necessitated him stopping his shots.  However, he has insurance again and is wanting to restart them.  Daniel Cooper is on allergen immunotherapy. He receives two injections. Immunotherapy script #1 contains molds. He currently receives 0.5 mL of the RED vial (1/100).  Immunotherapy script #2 contains trees, weeds, grasses, molds, dust mites and cat. He currently receives 0.49mL of the RED vial (1/100). He started shots November of 2018 and not yet reached maintenance. He receives his injections at an outside facility with our dosing parameters. HE felt much better on this regimen in the spring 2020 and if he is on the shots, he only needs to take a Zyrtec occasionally.  He has gotten off of all of his nasal sprays.  He has not needed prednisone or antibiotics.  He milligrams migraines.  He is working a daytime shift which seems to have improved the frequency of the migraines.  He is not taking metoprolol any longer.  He remains on a multivitamin and is taking fish oil.  They have been busy at their house doing a lot of drainage work on the lawn.  He has also been trying to get gastrograph, which seems to be more successful.  Otherwise, there have been no changes to his past medical history, surgical history, family history, or social history.  Assessment and Plan:  Nasser is a 34 y.o. male with:  Seasonal and perennial allergic rhinitis (trees, weeds, grasses, molds, dust mites and cat)  Chronic migraines - resolved with changing to a dayshift  Nasal congestion - with marked improvement with allergen immunotherapy   He is going to continue his shots at the same prescription.  We have frozen his maintenance of the pollen vial at 0.25 mL.  He has reached 0.5 mL of the mold vial.  We will get the vials delivered to him and he can  restart these at home.  His girlfriend, who is a Equities trader, administers them.  She is aware of the protocols.  We are going to advance on Schedule B since this is his fourth red vial, but we can decrease to Schedule A if needed.  Diagnostics: None.  Medication List:  Current Outpatient Medications  Medication Sig Dispense Refill   cetirizine (ZYRTEC) 10 MG tablet Take 10 mg by mouth daily.     Multiple Vitamins-Minerals  (MULTIVITAMIN ADULT PO) Take by mouth daily.     Needle, Disp, 27G X 1" MISC 0.5 mLs by Does not apply route once a week. 50 each 6   No current facility-administered medications for this visit.   Allergies: No Known Allergies I reviewed his past medical history, social history, family history, and environmental history and no significant changes have been reported from previous visits.  Review of Systems  Constitutional: Negative for chills, diaphoresis, fatigue and fever.  HENT: Positive for sinus pain. Negative for congestion, ear discharge, ear pain, facial swelling, postnasal drip, rhinorrhea, sinus pressure and sore throat.        Positive for sneezing.   Eyes: Negative for pain, discharge, redness and itching.  Respiratory: Negative for apnea, cough, chest tightness and shortness of breath.   Cardiovascular: Negative for chest pain.  Gastrointestinal: Negative for diarrhea and nausea.  Musculoskeletal: Negative for arthralgias and myalgias.  Skin: Negative for rash.  Allergic/Immunologic: Negative for environmental allergies and food allergies.    Objective:  Physical exam not obtained as encounter was done via telephone.   Previous notes and tests were reviewed.  I discussed the assessment and treatment plan with the patient. The patient was provided an opportunity to ask questions and all were answered. The patient agreed with the plan and demonstrated an understanding of the instructions.   The patient was advised to call back or seek an in-person evaluation if the symptoms worsen or if the condition fails to improve as anticipated.  I provided 19 minutes of non-face-to-face time during this encounter.  It was my pleasure to participate in Daniel Cooper care today. Please feel free to contact me with any questions or concerns.   Sincerely,  Valentina Shaggy, MD

## 2019-12-12 NOTE — Progress Notes (Signed)
Immunotherapy   Patient Details  Name: Daniel Cooper MRN: [id number] Date of Birth: 1986/07/24  12/12/2019  Daniel Cooper is starting his Red vials one with Mold and the other with Pollen-DM-Cat both with an expiration of 11/14/2020. Per Patient and Dr. Dellis Anes Vials and paperwork have been released to [facility] who will take them to patient. Following schedule: B Frequency: Weekly Epi-Pen: Yes Consent signed and patient instructions given.   [name] 12/12/2019, 3:45 PM

## 2020-01-03 ENCOUNTER — Emergency Department (HOSPITAL_BASED_OUTPATIENT_CLINIC_OR_DEPARTMENT_OTHER): Payer: Managed Care, Other (non HMO)

## 2020-01-03 ENCOUNTER — Observation Stay (HOSPITAL_BASED_OUTPATIENT_CLINIC_OR_DEPARTMENT_OTHER)
Admission: EM | Admit: 2020-01-03 | Discharge: 2020-01-05 | Disposition: A | Discharge status: Home / Self Care | Payer: Managed Care, Other (non HMO) | Attending: Physician Assistant | Admitting: Physician Assistant

## 2020-01-03 ENCOUNTER — Other Ambulatory Visit: Payer: Self-pay

## 2020-01-03 ENCOUNTER — Encounter (HOSPITAL_BASED_OUTPATIENT_CLINIC_OR_DEPARTMENT_OTHER): Payer: Self-pay | Admitting: Emergency Medicine

## 2020-01-03 DIAGNOSIS — J302 Other seasonal allergic rhinitis: Secondary | ICD-10-CM | POA: Diagnosis not present

## 2020-01-03 DIAGNOSIS — K3580 Unspecified acute appendicitis: Secondary | ICD-10-CM

## 2020-01-03 DIAGNOSIS — J301 Allergic rhinitis due to pollen: Secondary | ICD-10-CM | POA: Diagnosis not present

## 2020-01-03 DIAGNOSIS — Z20822 Contact with and (suspected) exposure to covid-19: Secondary | ICD-10-CM | POA: Insufficient documentation

## 2020-01-03 DIAGNOSIS — K3531 Acute appendicitis with localized peritonitis and gangrene, without perforation: Secondary | ICD-10-CM | POA: Diagnosis not present

## 2020-01-03 DIAGNOSIS — K37 Unspecified appendicitis: Secondary | ICD-10-CM | POA: Diagnosis present

## 2020-01-03 LAB — COMPREHENSIVE METABOLIC PANEL
ALT: 59 U/L — ABNORMAL HIGH (ref 0–44)
AST: 32 U/L (ref 15–41)
Albumin: 5 g/dL (ref 3.5–5.0)
Alkaline Phosphatase: 52 U/L (ref 38–126)
Anion gap: 12 (ref 5–15)
BUN: 13 mg/dL (ref 6–20)
CO2: 26 mmol/L (ref 22–32)
Calcium: 9.3 mg/dL (ref 8.9–10.3)
Chloride: 98 mmol/L (ref 98–111)
Creatinine, Ser: 1 mg/dL (ref 0.61–1.24)
GFR calc Af Amer: >60 mL/min (ref >60)
GFR calc non Af Amer: >60 mL/min (ref >60)
Glucose, Bld: 132 mg/dL — ABNORMAL HIGH (ref 70–99)
Potassium: 3.8 mmol/L (ref 3.5–5.1)
Sodium: 136 mmol/L (ref 135–145)
Total Bilirubin: 2.7 mg/dL — ABNORMAL HIGH (ref 0.3–1.2)
Total Protein: 8.1 g/dL (ref 6.5–8.1)

## 2020-01-03 LAB — CBC
HCT: 50.7 % (ref 39.0–52.0)
Hemoglobin: 18.2 g/dL — ABNORMAL HIGH (ref 13.0–17.0)
MCH: 30.3 pg (ref 26.0–34.0)
MCHC: 35.9 g/dL (ref 30.0–36.0)
MCV: 84.5 fL (ref 80.0–100.0)
Platelets: 213 10*3/uL (ref 150–400)
RBC: 6 MIL/uL — ABNORMAL HIGH (ref 4.22–5.81)
RDW: 11.9 % (ref 11.5–15.5)
WBC: 16.9 10*3/uL — ABNORMAL HIGH (ref 4.0–10.5)
nRBC: 0 % (ref 0.0–0.2)

## 2020-01-03 LAB — SARS CORONAVIRUS 2 BY RT PCR (HOSPITAL ORDER, PERFORMED IN ~~LOC~~ HOSPITAL LAB): SARS Coronavirus 2: NEGATIVE

## 2020-01-03 LAB — LIPASE, BLOOD: Lipase: 29 U/L (ref 11–51)

## 2020-01-03 MED ORDER — MORPHINE SULFATE (PF) 4 MG/ML IV SOLN
4.0000 mg | Freq: Once | INTRAVENOUS | Status: AC
  Administered 2020-01-03: 4 mg via INTRAVENOUS
  Filled 2020-01-03: qty 1

## 2020-01-03 MED ORDER — HYDROMORPHONE HCL 1 MG/ML IJ SOLN
1.0000 mg | Freq: Once | INTRAMUSCULAR | Status: AC
  Administered 2020-01-03: 1 mg via INTRAVENOUS
  Filled 2020-01-03: qty 1

## 2020-01-03 MED ORDER — ONDANSETRON HCL 4 MG/2ML IJ SOLN
4.0000 mg | Freq: Once | INTRAMUSCULAR | Status: AC | PRN
  Administered 2020-01-03: 4 mg via INTRAVENOUS
  Filled 2020-01-03: qty 2

## 2020-01-03 MED ORDER — SODIUM CHLORIDE 0.9 % IV SOLN
2.0000 g | Freq: Once | INTRAVENOUS | Status: AC
  Administered 2020-01-03: 2 g via INTRAVENOUS
  Filled 2020-01-03: qty 20

## 2020-01-03 MED ORDER — METRONIDAZOLE IN NACL 5-0.79 MG/ML-% IV SOLN
500.0000 mg | Freq: Once | INTRAVENOUS | Status: AC
  Administered 2020-01-04: 500 mg via INTRAVENOUS
  Filled 2020-01-03 (×2): qty 100

## 2020-01-03 MED ORDER — IOHEXOL 300 MG/ML  SOLN
100.0000 mL | Freq: Once | INTRAMUSCULAR | Status: AC
  Administered 2020-01-03: 100 mL via INTRAVENOUS

## 2020-01-03 MED ORDER — FAMOTIDINE IN NACL 20-0.9 MG/50ML-% IV SOLN
20.0000 mg | Freq: Once | INTRAVENOUS | Status: AC
  Administered 2020-01-03: 20 mg via INTRAVENOUS
  Filled 2020-01-03: qty 50

## 2020-01-03 NOTE — ED Provider Notes (Signed)
[place] EMERGENCY DEPARTMENT Provider Note   CSN: [id number] Arrival date & time: 01/03/20  2012     History Chief Complaint  Patient presents with  . Abdominal Pain    Daniel Cooper is a 34 y.o. male who presents to ED with a chief complaint of abdominal pain, diarrhea.  Yesterday started experiencing several episodes of diarrhea with some formed stool.  This morning started having vomiting and lower abdominal pain.  Has not had a bowel movement all day as he reports decreased appetite and vomiting.  He has not tried medications to help with the symptoms.  He initially thought this was due to something that he may eat yesterday.  No sick contacts with similar symptoms, dysuria, fever, chest pain, shortness of breath or prior abdominal surgeries.  Reports occasional alcohol use.  HPI     Past Medical History:  Diagnosis Date  . History of rhabdomyolysis 08/2014   after intense workout  . Seasonal allergies   . VSD (ventricular septal defect)    told closed    Patient Active Problem List   Diagnosis Date Noted  . Seasonal and perennial allergic rhinitis 09/14/2017  . Migraine 09/14/2017  . Gastroesophageal reflux disease 09/14/2017  . TTH (tension-type headache) 02/17/2017  . Recurrent sinus infections 01/01/2017  . Frequent headaches 01/01/2017  . Health maintenance examination 01/23/2015  . Skin rash 01/23/2015  . Non-traumatic rhabdomyolysis 09/07/2014  . Non-seasonal allergic rhinitis due to pollen 11/28/2010    Past Surgical History:  Procedure Laterality Date  . TONSILLECTOMY         Family History  Problem Relation Age of Onset  . Stroke Maternal Uncle 40  . Prostate cancer Paternal Grandfather 51  . Coronary artery disease Maternal Grandmother        CABG; +smoker  . Hypertension Other        some in family    Social History   Tobacco Use  . Smoking status: Never Smoker  . Smokeless tobacco: Never Used  Substance Use Topics   . Alcohol use: Yes    Comment: Occasional  . Drug use: No    Home Medications Prior to Admission medications   Medication Sig Start Date End Date Taking? Authorizing Provider  cetirizine (ZYRTEC) 10 MG tablet Take 10 mg by mouth daily.    [provider]  Multiple Vitamins-Minerals (MULTIVITAMIN ADULT PO) Take by mouth daily.    [provider]  Needle, Disp, 27G X 1" MISC 0.5 mLs by Does not apply route once a week. 03/26/17   Valentina Shaggy, MD    Allergies    Patient has no known allergies.  Review of Systems   Review of Systems  Constitutional: Negative for appetite change, chills and fever.  HENT: Negative for ear pain, rhinorrhea, sneezing and sore throat.   Eyes: Negative for photophobia and visual disturbance.  Respiratory: Negative for cough, chest tightness, shortness of breath and wheezing.   Cardiovascular: Negative for chest pain and palpitations.  Gastrointestinal: Positive for abdominal pain, diarrhea, nausea and vomiting. Negative for blood in stool and constipation.  Genitourinary: Negative for dysuria, hematuria and urgency.  Musculoskeletal: Negative for myalgias.  Skin: Negative for rash.  Neurological: Negative for dizziness, weakness and light-headedness.    Physical Exam Updated Vital Signs BP (!) 168/93 (BP Location: Right Arm)   Pulse 77   Temp 98.3 F (36.8 C) (Oral)   Resp 16   Ht 5\' 8"  (1.727 m)   Wt  77.1 kg   SpO2 99%   BMI 25.85 kg/m   Physical Exam Vitals and nursing note reviewed.  Constitutional:      General: He is not in acute distress.    Appearance: He is well-developed.  HENT:     Head: Normocephalic and atraumatic.     Nose: Nose normal.  Eyes:     General: No scleral icterus.       Left eye: No discharge.     Conjunctiva/sclera: Conjunctivae normal.  Cardiovascular:     Rate and Rhythm: Normal rate and regular rhythm.     Heart sounds: Normal heart sounds. No murmur. No friction rub. No gallop.    Pulmonary:     Effort: Pulmonary effort is normal. No respiratory distress.     Breath sounds: Normal breath sounds.  Abdominal:     General: Bowel sounds are normal. There is no distension.     Palpations: Abdomen is soft.     Tenderness: There is abdominal tenderness in the right lower quadrant, periumbilical area, suprapubic area and left lower quadrant. There is no guarding.  Musculoskeletal:        General: Normal range of motion.     Cervical back: Normal range of motion and neck supple.  Skin:    General: Skin is warm and dry.     Findings: No rash.  Neurological:     Mental Status: He is alert.     Motor: No abnormal muscle tone.     Coordination: Coordination normal.     ED Results / Procedures / Treatments   Labs (all labs ordered are listed, but only abnormal results are displayed) Labs Reviewed  COMPREHENSIVE METABOLIC PANEL - Abnormal; Notable for the following components:      Result Value   Glucose, Bld 132 (*)    ALT 59 (*)    Total Bilirubin 2.7 (*)    All other components within normal limits  CBC - Abnormal; Notable for the following components:   WBC 16.9 (*)    RBC 6.00 (*)    Hemoglobin 18.2 (*)    All other components within normal limits  SARS CORONAVIRUS 2 BY RT PCR (HOSPITAL ORDER, PERFORMED IN [facility])  LIPASE, BLOOD    EKG None  Radiology CT ABDOMEN PELVIS W CONTRAST  Result Date: 01/03/2020 CLINICAL DATA:  34 year old male with right upper quadrant abdominal pain, vomiting, diarrhea last night. EXAM: CT ABDOMEN AND PELVIS WITH CONTRAST TECHNIQUE: Multidetector CT imaging of the abdomen and pelvis was performed using the standard protocol following bolus administration of intravenous contrast. CONTRAST:  OMNIPAQUE IOHEXOL 300 MG/ML  SOLN COMPARISON:  None. FINDINGS: Lower chest: Negative. Hepatobiliary: Negative. Pancreas: Negative. Spleen: Negative. Adrenals/Urinary Tract: Normal adrenal glands. Symmetric and normal  renal enhancement. No hydronephrosis. Decompressed proximal ureters. The abnormal distal appendix abuts the right lateral wall of the urinary bladder on series 2, image 62. The bladder is otherwise unremarkable. Stomach/Bowel: Mostly decompressed large bowel from the distal transverse colon to the rectum. Mild retained stool in the sigmoid. Oral contrast has reached the ascending colon. The terminal ileum and ileocecal valve are identified on coronal image 41. Just caudal to that an abnormal appendix arises from the cecum on coronal image 46 and tracks posteriorly and inferiorly into the right hemipelvis (series 5, image 55). Appendix: Location: Medial to the cecum, tracking to the right lateral pelvis. Diameter: Up to 17 mm Appendicolith: Positive, about 16 mm in length on series 2, image  53. Mucosal hyper-enhancement: Negative. Extraluminal gas: None. Periappendiceal collection: Inflammation, with only small volume reactive appearing fluid primarily in the pelvis. Terminal ileum and other distal small bowel loops are decompressed. No dilated small bowel. Negative stomach. No free air. Vascular/Lymphatic: Major arterial structures appear patent and normal. Portal venous system is patent. Central venous structures appear patent. Reproductive: Negative. Other: Small volume of mildly complex free fluid in the pelvis (series 2, image 63). Musculoskeletal: No acute osseous abnormality identified. IMPRESSION: Positive for Acute Appendicitis. Appendicolith with dilated and hypo-enhancing downstream appendix which tracks into the right hemipelvis. Small volume mildly complex fluid in the pelvis but no organized abscess. Electronically Signed   By: Odessa Fleming M.D.   On: 01/03/2020 22:28    Procedures Procedures (including critical care time)  Medications Ordered in ED Medications  cefTRIAXone (ROCEPHIN) 2 g in sodium chloride 0.9 % 100 mL IVPB (has no administration in time range)    And  metroNIDAZOLE (FLAGYL) IVPB  500 mg (has no administration in time range)  ondansetron (ZOFRAN) injection 4 mg (4 mg Intravenous Given 01/03/20 2120)  famotidine (PEPCID) IVPB 20 mg premix (20 mg Intravenous New Bag/Given 01/03/20 2126)  morphine 4 MG/ML injection 4 mg (4 mg Intravenous Given 01/03/20 2124)  iohexol (OMNIPAQUE) 300 MG/ML solution 100 mL (100 mLs Intravenous Contrast Given 01/03/20 2212)    ED Course  I have reviewed the triage vital signs and the nursing notes.  Pertinent labs & imaging results that were available during my care of the patient were reviewed by me and considered in my medical decision making (see chart for details).    MDM Rules/Calculators/A&P                      34 year old male presenting to the ED with a chief complaint of abdominal pain, diarrhea and vomiting.  Symptoms began yesterday.  Reports no bowel movement or urination today secondary to decreased p.o. intake.  On exam he has tenderness palpation of the lower abdomen periumbilical area without rebound or guarding.  He is afebrile without recent use of antipyretics.  Lab work significant for leukocytosis of 16.9, T bili of 2.7.  Lipase is normal.  CT of the abdomen pelvis shows findings consistent with acute appendicitis.  Covid test ordered.  Will begin patient on IV antibiotics, transfer over to [facility] to be seen by general surgery, Dr. Magnus Ivan. I have spoken to Dr. Silverio Lay at [facility] who accepts for ED to ED transfer. Patient's last meal was yesterday.  All imaging, if done today, including plain films, CT scans, and ultrasounds, independently reviewed by me, and interpretations confirmed via formal radiology reads.   Portions of this note were generated with Scientist, clinical (histocompatibility and immunogenetics). Dictation errors may occur despite best attempts at proofreading.  Final Clinical Impression(s) / ED Diagnoses Final diagnoses:  Acute appendicitis, unspecified acute appendicitis type    Rx / DC Orders ED Discharge Orders    None        Dietrich Pates, PA-C 01/03/20 2241    Milagros Loll, MD 01/04/20 1309

## 2020-01-03 NOTE — ED Triage Notes (Signed)
Pt states he had diarrhea last night  This morning started having abd pain   Vomiting started this morning around 10 am  Pt states he has not had a BM or voided at all today  Pt states the abd pain is constant mainly on the right side

## 2020-01-03 NOTE — ED Notes (Signed)
[facility] notified (Daniel Cooper) - patient ready for transport 

## 2020-01-04 ENCOUNTER — Encounter (HOSPITAL_COMMUNITY): Payer: Self-pay | Admitting: Surgery

## 2020-01-04 ENCOUNTER — Inpatient Hospital Stay (HOSPITAL_COMMUNITY): Payer: Managed Care, Other (non HMO) | Admitting: Certified Registered Nurse Anesthetist

## 2020-01-04 ENCOUNTER — Encounter (HOSPITAL_COMMUNITY)
Admission: EM | Disposition: A | Discharge status: Home / Self Care | Payer: Self-pay | Source: Home / Self Care | Attending: Emergency Medicine

## 2020-01-04 DIAGNOSIS — K37 Unspecified appendicitis: Secondary | ICD-10-CM | POA: Diagnosis present

## 2020-01-04 HISTORY — PX: LAPAROSCOPIC APPENDECTOMY: SHX408

## 2020-01-04 LAB — CBC
HCT: 48.3 % (ref 39.0–52.0)
Hemoglobin: 16.7 g/dL (ref 13.0–17.0)
MCH: 30.4 pg (ref 26.0–34.0)
MCHC: 34.6 g/dL (ref 30.0–36.0)
MCV: 87.8 fL (ref 80.0–100.0)
Platelets: 189 10*3/uL (ref 150–400)
RBC: 5.5 MIL/uL (ref 4.22–5.81)
RDW: 11.9 % (ref 11.5–15.5)
WBC: 18.1 10*3/uL — ABNORMAL HIGH (ref 4.0–10.5)
nRBC: 0 % (ref 0.0–0.2)

## 2020-01-04 LAB — HIV ANTIBODY (ROUTINE TESTING W REFLEX): HIV Screen 4th Generation wRfx: NONREACTIVE

## 2020-01-04 LAB — MRSA PCR SCREENING: MRSA by PCR: NEGATIVE

## 2020-01-04 SURGERY — APPENDECTOMY, LAPAROSCOPIC
Anesthesia: General | Site: Abdomen

## 2020-01-04 MED ORDER — POTASSIUM CHLORIDE IN NACL 20-0.9 MEQ/L-% IV SOLN
INTRAVENOUS | Status: DC
  Filled 2020-01-04 (×2): qty 1000

## 2020-01-04 MED ORDER — ROCURONIUM BROMIDE 10 MG/ML (PF) SYRINGE
PREFILLED_SYRINGE | INTRAVENOUS | Status: DC | PRN
  Administered 2020-01-04: 40 mg via INTRAVENOUS
  Administered 2020-01-04 (×2): 20 mg via INTRAVENOUS

## 2020-01-04 MED ORDER — DEXAMETHASONE SODIUM PHOSPHATE 10 MG/ML IJ SOLN
INTRAMUSCULAR | Status: DC | PRN
  Administered 2020-01-04: 10 mg via INTRAVENOUS

## 2020-01-04 MED ORDER — OXYCODONE HCL 5 MG PO TABS: 5.0000 mg | ORAL_TABLET | Freq: Once | ORAL | Status: DC | PRN

## 2020-01-04 MED ORDER — PROPOFOL 10 MG/ML IV BOLUS
INTRAVENOUS | Status: DC | PRN
  Administered 2020-01-04: 200 mg via INTRAVENOUS

## 2020-01-04 MED ORDER — LACTATED RINGERS IV SOLN
INTRAVENOUS | Status: AC | PRN
  Administered 2020-01-04: 1000 mL via INTRAVENOUS

## 2020-01-04 MED ORDER — DIPHENHYDRAMINE HCL 50 MG/ML IJ SOLN: 25.0000 mg | Freq: Four times a day (QID) | INTRAMUSCULAR | Status: DC | PRN

## 2020-01-04 MED ORDER — ONDANSETRON HCL 4 MG/2ML IJ SOLN
INTRAMUSCULAR | Status: AC
  Filled 2020-01-04: qty 2

## 2020-01-04 MED ORDER — FENTANYL CITRATE (PF) 100 MCG/2ML IJ SOLN
INTRAMUSCULAR | Status: AC
  Filled 2020-01-04: qty 2

## 2020-01-04 MED ORDER — MORPHINE SULFATE (PF) 2 MG/ML IV SOLN
2.0000 mg | INTRAVENOUS | Status: DC | PRN
  Administered 2020-01-04: 4 mg via INTRAVENOUS
  Administered 2020-01-04: 2 mg via INTRAVENOUS
  Administered 2020-01-04: 4 mg via INTRAVENOUS
  Administered 2020-01-04: 2 mg via INTRAVENOUS
  Administered 2020-01-04: 4 mg via INTRAVENOUS
  Filled 2020-01-04: qty 2
  Filled 2020-01-04: qty 1
  Filled 2020-01-04: qty 2
  Filled 2020-01-04: qty 1
  Filled 2020-01-04: qty 2

## 2020-01-04 MED ORDER — DEXAMETHASONE SODIUM PHOSPHATE 10 MG/ML IJ SOLN
INTRAMUSCULAR | Status: AC
  Filled 2020-01-04: qty 1

## 2020-01-04 MED ORDER — LIDOCAINE 2% (20 MG/ML) 5 ML SYRINGE
INTRAMUSCULAR | Status: DC | PRN
  Administered 2020-01-04: 60 mg via INTRAVENOUS

## 2020-01-04 MED ORDER — PIPERACILLIN-TAZOBACTAM 3.375 G IVPB
3.3750 g | Freq: Three times a day (TID) | INTRAVENOUS | Status: DC
  Administered 2020-01-04 – 2020-01-05 (×5): 3.375 g via INTRAVENOUS
  Filled 2020-01-04 (×7): qty 50

## 2020-01-04 MED ORDER — FENTANYL CITRATE (PF) 100 MCG/2ML IJ SOLN: 25.0000 ug | INTRAMUSCULAR | Status: DC | PRN

## 2020-01-04 MED ORDER — ONDANSETRON HCL 4 MG/2ML IJ SOLN
INTRAMUSCULAR | Status: DC | PRN
  Administered 2020-01-04: 4 mg via INTRAVENOUS

## 2020-01-04 MED ORDER — LACTATED RINGERS IV SOLN: INTRAVENOUS | Status: DC

## 2020-01-04 MED ORDER — MIDAZOLAM HCL 5 MG/5ML IJ SOLN
INTRAMUSCULAR | Status: DC | PRN
  Administered 2020-01-04: 2 mg via INTRAVENOUS

## 2020-01-04 MED ORDER — DIPHENHYDRAMINE HCL 25 MG PO CAPS: 25.0000 mg | ORAL_CAPSULE | Freq: Four times a day (QID) | ORAL | Status: DC | PRN

## 2020-01-04 MED ORDER — BUPIVACAINE-EPINEPHRINE 0.5% -1:200000 IJ SOLN
INTRAMUSCULAR | Status: DC | PRN
  Administered 2020-01-04: 18 mL

## 2020-01-04 MED ORDER — FENTANYL CITRATE (PF) 100 MCG/2ML IJ SOLN
INTRAMUSCULAR | Status: DC | PRN
  Administered 2020-01-04 (×2): 50 ug via INTRAVENOUS
  Administered 2020-01-04: 100 ug via INTRAVENOUS

## 2020-01-04 MED ORDER — OXYCODONE HCL 5 MG/5ML PO SOLN: 5.0000 mg | Freq: Once | ORAL | Status: DC | PRN

## 2020-01-04 MED ORDER — MIDAZOLAM HCL 2 MG/2ML IJ SOLN
INTRAMUSCULAR | Status: AC
  Filled 2020-01-04: qty 2

## 2020-01-04 MED ORDER — CELECOXIB 200 MG PO CAPS
400.0000 mg | ORAL_CAPSULE | ORAL | Status: AC
  Administered 2020-01-04: 400 mg via ORAL
  Filled 2020-01-04: qty 2

## 2020-01-04 MED ORDER — SODIUM CHLORIDE 0.9 % IR SOLN
Status: DC | PRN
  Administered 2020-01-04: 1000 mL

## 2020-01-04 MED ORDER — LIDOCAINE 2% (20 MG/ML) 5 ML SYRINGE
INTRAMUSCULAR | Status: AC
  Filled 2020-01-04: qty 5

## 2020-01-04 MED ORDER — ONDANSETRON 4 MG PO TBDP: 4.0000 mg | ORAL_TABLET | Freq: Four times a day (QID) | ORAL | Status: DC | PRN

## 2020-01-04 MED ORDER — ACETAMINOPHEN 500 MG PO TABS
1000.0000 mg | ORAL_TABLET | ORAL | Status: AC
  Administered 2020-01-04: 1000 mg via ORAL
  Filled 2020-01-04: qty 2

## 2020-01-04 MED ORDER — SUGAMMADEX SODIUM 200 MG/2ML IV SOLN
INTRAVENOUS | Status: DC | PRN
  Administered 2020-01-04: 300 mg via INTRAVENOUS

## 2020-01-04 MED ORDER — ENOXAPARIN SODIUM 40 MG/0.4ML ~~LOC~~ SOLN
40.0000 mg | Freq: Every day | SUBCUTANEOUS | Status: DC
  Administered 2020-01-04 (×2): 40 mg via SUBCUTANEOUS
  Filled 2020-01-04 (×2): qty 0.4

## 2020-01-04 MED ORDER — ONDANSETRON HCL 4 MG/2ML IJ SOLN
4.0000 mg | Freq: Four times a day (QID) | INTRAMUSCULAR | Status: DC | PRN
  Administered 2020-01-04: 4 mg via INTRAVENOUS
  Filled 2020-01-04: qty 2

## 2020-01-04 MED ORDER — BUPIVACAINE-EPINEPHRINE 0.5% -1:200000 IJ SOLN
INTRAMUSCULAR | Status: AC
  Filled 2020-01-04: qty 1

## 2020-01-04 MED ORDER — ONDANSETRON HCL 4 MG/2ML IJ SOLN: 4.0000 mg | Freq: Once | INTRAMUSCULAR | Status: DC | PRN

## 2020-01-04 MED ORDER — GABAPENTIN 300 MG PO CAPS
300.0000 mg | ORAL_CAPSULE | ORAL | Status: AC
  Administered 2020-01-04: 300 mg via ORAL
  Filled 2020-01-04: qty 1

## 2020-01-04 MED ORDER — ROCURONIUM BROMIDE 10 MG/ML (PF) SYRINGE
PREFILLED_SYRINGE | INTRAVENOUS | Status: AC
  Filled 2020-01-04: qty 10

## 2020-01-04 SURGICAL SUPPLY — 43 items
APPLIER CLIP 5 13 M/L LIGAMAX5 (MISCELLANEOUS)
APPLIER CLIP ROT 10 11.4 M/L (STAPLE)
CABLE HIGH FREQUENCY MONO STRZ (ELECTRODE) IMPLANT
CHLORAPREP W/TINT 26 (MISCELLANEOUS) ×2 IMPLANT
CLIP APPLIE 5 13 M/L LIGAMAX5 (MISCELLANEOUS) IMPLANT
CLIP APPLIE ROT 10 11.4 M/L (STAPLE) IMPLANT
COVER SURGICAL LIGHT HANDLE (MISCELLANEOUS) ×2 IMPLANT
COVER WAND RF STERILE (DRAPES) IMPLANT
CUTTER FLEX LINEAR 45M (STAPLE) ×2 IMPLANT
DECANTER SPIKE VIAL GLASS SM (MISCELLANEOUS) ×2 IMPLANT
DERMABOND ADVANCED (GAUZE/BANDAGES/DRESSINGS) ×1
DERMABOND ADVANCED .7 DNX12 (GAUZE/BANDAGES/DRESSINGS) ×1 IMPLANT
DRAIN CHANNEL 19F RND (DRAIN) IMPLANT
DRAPE LAPAROSCOPIC ABDOMINAL (DRAPES) ×2 IMPLANT
ELECT PENCIL ROCKER SW 15FT (MISCELLANEOUS) IMPLANT
ELECT REM PT RETURN 15FT ADLT (MISCELLANEOUS) ×2 IMPLANT
ENDOLOOP SUT PDS II  0 18 (SUTURE)
ENDOLOOP SUT PDS II 0 18 (SUTURE) IMPLANT
EVACUATOR SILICONE 100CC (DRAIN) IMPLANT
GLOVE BIO SURGEON STRL SZ7.5 (GLOVE) ×2 IMPLANT
GLOVE INDICATOR 8.0 STRL GRN (GLOVE) ×2 IMPLANT
GOWN STRL REUS W/TWL XL LVL3 (GOWN DISPOSABLE) ×4 IMPLANT
KIT BASIN (CUSTOM PROCEDURE TRAY) ×2 IMPLANT
KIT TURNOVER KIT A (KITS) IMPLANT
POUCH SPECIMEN RETRIEVAL 10MM (ENDOMECHANICALS) ×2 IMPLANT
RELOAD 45 VASCULAR/THIN (ENDOMECHANICALS) IMPLANT
RELOAD STAPLE 45 2.5 WHT GRN (ENDOMECHANICALS) IMPLANT
RELOAD STAPLE 45 3.5 BLU ETS (ENDOMECHANICALS) IMPLANT
RELOAD STAPLE TA45 3.5 REG BLU (ENDOMECHANICALS) ×2 IMPLANT
SCISSORS LAP 5X35 DISP (ENDOMECHANICALS) IMPLANT
SET IRRIG TUBING LAPAROSCOPIC (IRRIGATION / IRRIGATOR) ×2 IMPLANT
SET TUBE SMOKE EVAC HIGH FLOW (TUBING) ×2 IMPLANT
SHEARS HARMONIC ACE PLUS 36CM (ENDOMECHANICALS) ×2 IMPLANT
SLEEVE ADV FIXATION 5X100MM (TROCAR) ×2 IMPLANT
SUT ETHILON 3 0 PS 1 (SUTURE) IMPLANT
SUT MNCRL AB 4-0 PS2 18 (SUTURE) ×2 IMPLANT
TOWEL OR 17X26 10 PK STRL BLUE (TOWEL DISPOSABLE) ×2 IMPLANT
TOWEL OR NON WOVEN STRL DISP B (DISPOSABLE) ×2 IMPLANT
TRAY FOLEY MTR SLVR 14FR STAT (SET/KITS/TRAYS/PACK) IMPLANT
TRAY FOLEY MTR SLVR 16FR STAT (SET/KITS/TRAYS/PACK) IMPLANT
TRAY LAPAROSCOPIC (CUSTOM PROCEDURE TRAY) ×2 IMPLANT
TROCAR ADV FIXATION 5X100MM (TROCAR) ×2 IMPLANT
TROCAR XCEL BLUNT TIP 100MML (ENDOMECHANICALS) ×2 IMPLANT

## 2020-01-04 NOTE — Plan of Care (Signed)
  Problem: Activity: Goal: Risk for activity intolerance will decrease Outcome: Progressing   

## 2020-01-04 NOTE — Progress Notes (Signed)
   01/04/20 0626  Assess: MEWS Score  Temp (!) 101.5 F (38.6 C)  BP (!) 157/92  Pulse Rate (!) 108  Resp 19  Level of Consciousness Alert  SpO2 96 %  O2 Device Room Air  Assess: MEWS Score  MEWS Temp 2  MEWS Systolic 0  MEWS Pulse 1  MEWS RR 0  MEWS LOC 0  MEWS Score 3  MEWS Score Color Yellow  Assess: if the MEWS score is Yellow or Red  Were vital signs taken at a resting state? Yes  Focused Assessment Documented focused assessment  Early Detection of Sepsis Score *See Row Information* Medium  MEWS guidelines implemented *See Row Information* Yes  Treat  MEWS Interventions Administered prn meds/treatments  Take Vital Signs  Increase Vital Sign Frequency  Yellow: Q 2hr X 2 then Q 4hr X 2, if remains yellow, continue Q 4hrs  Escalate  MEWS: Escalate Yellow: discuss with charge nurse/RN and consider discussing with provider and RRT  Notify: Charge Nurse/RN  Name of Charge Nurse/RN Notified Tom  Date Charge Nurse/RN Notified 01/04/20  Time Charge Nurse/RN Notified 0630  patient to have sx appendectomy this morning, AxO x4 , IV ABX given

## 2020-01-04 NOTE — Op Note (Signed)
Daniel Cooper [id number]   PRE-OPERATIVE DIAGNOSIS:  Acute appendicitis  POST-OPERATIVE DIAGNOSIS:  Acute gangrenous appendicitis without perforation/abscess  Procedure(s): APPENDECTOMY LAPAROSCOPIC  PROCEDURE: Laparoscopic appendectomy  SURGEON:  Sharon Mt. Dema Severin, M.D.  ANESTHESIA: General endotracheal  EBL:   5 mL  DRAINS: None  SPECIMEN:  Appendix  COUNTS:  Sponge, needle and instrument counts were reported correct x2 at conclusion of the operation  DISPOSITION:  PACU in satisfactory condition  COMPLICATIONS: None  FINDINGS: Acutely inflamed gangrenous appendicitis with viable appendiceal base/cecum. Normal appearing terminal ileum and ascending colon. No evident perforation or abscess. Some pelvic ascites.  INDICATIONS: Daniel Cooper is a very pleasant 33yoM with 1 day history of significant right lower quadrant abdominal pain and fevers.  He presented to the emergency room at [facility] and had a CT scan performed after demonstrating right lower quadrant tenderness and a leukocytosis.  This showed findings consistent with acute appendicitis without any evident perforation or abscess.  He was transferred to our facility for further treatment.  He had met with our service we discussed options going forward.  He opted to pursue surgery.  Please refer to notes elsewhere for details regarding this discussion.  DESCRIPTION:   The patient was identified & brought into the operating room. SCDs were in place and functioning. General endotracheal anesthesia was administered. Preoperative antibiotics were administered. The patient was positioned supine with left arm tucked. Hair on the abdomen was then clipped by the OR team. The abdomen was prepped and draped in the standard sterile fashion. A surgical timeout confirmed our plan.  A small incision was made in the infraumbilical skin. The subcutaneous tissue was dissected and the umbilical stalk identified. The stalk  was grasped with a Kocher and retracted outwardly. The infraumbilical fascia was exposed and incised. Peritoneal entry was carefully made bluntly. A 0 Vicryl purse-string suture was placed and then the [facility] port was introduced into the abdomen.  CO2 insufflation commenced to 24mHg. The laparoscope was inserted and confirmed no evidence of trocar site complications. The patient was then positioned in Trendelenburg. Two additional ports were placed - one in left lower quadrant and another in the suprapubic midline taking care to stay well above the bladder - 3 fingerbreadths above the pubic symphysis. The bed was then slightly tilted to place the left side down.  The appendix was identified and attachments to the appendix to the surrounding tissues were freed without difficulty.  The appendix was gangrenous in appearance, firm, fibrotic, and fragile.  The appendix was elevated.  The base of the appendix was circumferentially dissected taking care to preserve the cecum free of injury. The base was noted to be viable and healthy appearing. The terminal ileum, cecum and ascending colon also appeared normal. The base of the appendix was then stapled with a blue load, taking the appendix flush with the cecum. The mesoappendix was then ligated by "hugging" the appendix using the harmonic scalpel. The mesoappendix was inspected and noted to be hemostatic. The appendix was placed in an EndoBag.  The right lower quadrant was conservatively irrigated.  He did have a small amount of pelvic ascites that appeared simple.  There is no evident perforation from his appendix but given the fragile nature of the tissues with his appendix, it certainly possible that it fractured once inside the specimen retrieval bag during removal.  Hemostasis was noted to be achieved - taking time to inspect the ligated mesoappendix, colon mesentery, and retroperitoneum. Staple line was  noted to be intact on the cecum with no bleeding. There was  no perforation or injury. The right lower quadrant appeared clean and as such, no drain was placed.  The left lower quadrant and suprapubic ports were removed under direct visualization. The EndoBag was then removed through the umbilical port site and passed off as specimen. The CO2 was exhausted from the abdomen. The umbilical fascia was then closed by closing the 0 Vicryl suture. The fascia was palpated and noted to be completely closed. The skin of all port sites was then approximated using 4-0 Monocryl suture. The incisions were covered with Dermabond.  He was then awakened from general anesthesia, extubated, and transferred to a stretcher for transport to recover in satisfactory condition.

## 2020-01-04 NOTE — Discharge Instructions (Signed)
[facility] LAPAROSCOPIC SURGERY: POST OP INSTRUCTIONS Always review your discharge instruction sheet given to you by the facility where your surgery was performed. IF YOU HAVE DISABILITY OR FAMILY LEAVE FORMS, YOU MUST BRING THEM TO THE OFFICE FOR PROCESSING.   DO NOT GIVE THEM TO YOUR DOCTOR.  PAIN CONTROL  1. First take acetaminophen (Tylenol) AND/or ibuprofen (Advil) to control your pain after surgery.  Follow directions on package.  Taking acetaminophen (Tylenol) and/or ibuprofen (Advil) regularly after surgery will help to control your pain and lower the amount of prescription pain medication you may need.  You should not take more than 3,000 mg (3 grams) of acetaminophen (Tylenol) in 24 hours.  You should not take ibuprofen (Advil), aleve, motrin, naprosyn or other NSAIDS if you have a history of stomach ulcers or chronic kidney disease.  2. A prescription for pain medication may be given to you upon discharge.  Take your pain medication as prescribed, if you still have uncontrolled pain after taking acetaminophen (Tylenol) or ibuprofen (Advil). 3. Use ice packs to help control pain. 4. If you need a refill on your pain medication, please contact your pharmacy.  They will contact our office to request authorization. Prescriptions will not be filled after 5pm or on week-ends.  HOME MEDICATIONS 5. Take your usually prescribed medications unless otherwise directed.  DIET 6. You should follow a light diet the first few days after arrival home.  Be sure to include lots of fluids daily. Avoid fatty, fried foods.   CONSTIPATION 7. It is common to experience some constipation after surgery and if you are taking pain medication.  Increasing fluid intake and taking a stool softener (such as Colace) will usually help or prevent this problem from occurring.  A mild laxative (Milk of Magnesia or Miralax) should be taken according to package instructions if there are no bowel  movements after 48 hours.  WOUND/INCISION CARE 8. Most patients will experience some swelling and bruising in the area of the incisions.  Ice packs will help.  Swelling and bruising can take several days to resolve.  9. Unless discharge instructions indicate otherwise, follow guidelines below  a. STERI-STRIPS - you may remove your outer bandages 48 hours after surgery, and you may shower at that time.  You have steri-strips (small skin tapes) in place directly over the incision.  These strips should be left on the skin for 7-10 days.   b. DERMABOND/SKIN GLUE - you may shower in 24 hours.  The glue will flake off over the next 2-3 weeks. 10. Any sutures or staples will be removed at the office during your follow-up visit.  ACTIVITIES 11. You may resume regular (light) daily activities beginning the next day--such as daily self-care, walking, climbing stairs--gradually increasing activities as tolerated.  You may have sexual intercourse when it is comfortable.  Refrain from any heavy lifting or straining until approved by your doctor. a. You may drive when you are no longer taking prescription pain medication, you can comfortably wear a seatbelt, and you can safely maneuver your car and apply brakes.  FOLLOW-UP 12. You should see your doctor in the office for a follow-up appointment approximately 2-3 weeks after your surgery.  You should have been given your post-op/follow-up appointment when your surgery was scheduled.  If you did not receive a post-op/follow-up appointment, make sure that you call for this appointment within a day or two after you arrive home to insure a convenient appointment time.     WHEN TO CALL YOUR DOCTOR: 1. Fever over 101.0 2. Inability to urinate 3. Continued bleeding from incision. 4. Increased pain, redness, or drainage from the incision. 5. Increasing abdominal pain  The clinic staff is available to answer your questions during regular business hours.  Please don't  hesitate to call and ask to speak to one of the nurses for clinical concerns.  If you have a medical emergency, go to the nearest emergency room or call 911.  A surgeon from [facility] is always on call at the hospital. [address], [address], [place], [place]  [address] ? [address] [address], , [place]   [address] [phone] ? [phone] ? FAX [phone] Web site: [email or web address]  .........   Managing Your Pain After Surgery Without Opioids    Thank you for participating in our program to help patients manage their pain after surgery without opioids. This is part of our effort to provide you with the best care possible, without exposing you or your family to the risk that opioids pose.  What pain can I expect after surgery? You can expect to have some pain after surgery. This is normal. The pain is typically worse the day after surgery, and quickly begins to get better. Many studies have found that many patients are able to manage their pain after surgery with Over-the-Counter (OTC) medications such as Tylenol and Motrin. If you have a condition that does not allow you to take Tylenol or Motrin, notify your surgical team.  How will I manage my pain? The best strategy for controlling your pain after surgery is around the clock pain control with Tylenol (acetaminophen) and Motrin (ibuprofen or Advil). Alternating these medications with each other allows you to maximize your pain control. In addition to Tylenol and Motrin, you can use heating pads or ice packs on your incisions to help reduce your pain.  How will I alternate your regular strength over-the-counter pain medication? You will take a dose of pain medication every three hours. ; Start by taking 650 mg of Tylenol (2 pills of 325 mg) ; 3 hours later take 600 mg of Motrin (3 pills of 200 mg) ; 3 hours after taking the Motrin take 650 mg of Tylenol ; 3 hours after that take 600 mg of  Motrin.   - 1 -  See example - if your first dose of Tylenol is at 12:00 PM   12:00 PM Tylenol 650 mg (2 pills of 325 mg)  3:00 PM Motrin 600 mg (3 pills of 200 mg)  6:00 PM Tylenol 650 mg (2 pills of 325 mg)  9:00 PM Motrin 600 mg (3 pills of 200 mg)  Continue alternating every 3 hours   We recommend that you follow this schedule around-the-clock for at least 3 days after surgery, or until you feel that it is no longer needed. Use the table on the last page of this handout to keep track of the medications you are taking. Important: Do not take more than 3000mg of Tylenol or 3200mg of Motrin in a 24-hour period. Do not take ibuprofen/Motrin if you have a history of bleeding stomach ulcers, severe kidney disease, &/or actively taking a blood thinner  What if I still have pain? If you have pain that is not controlled with the over-the-counter pain medications (Tylenol and Motrin or Advil) you might have what we call "breakthrough" pain. You will receive a prescription for a small amount of an opioid pain medication such as   Oxycodone, Tramadol, or Tylenol with Codeine. Use these opioid pills in the first 24 hours after surgery if you have breakthrough pain. Do not take more than 1 pill every 4-6 hours.  If you still have uncontrolled pain after using all opioid pills, don't hesitate to call our staff using the number provided. We will help make sure you are managing your pain in the best way possible, and if necessary, we can provide a prescription for additional pain medication.   Day 1    Time  Name of Medication Number of pills taken  Amount of Acetaminophen  Pain Level   Comments  AM PM       AM PM       AM PM       AM PM       AM PM       AM PM       AM PM       AM PM       Total Daily amount of Acetaminophen Do not take more than  3,000 mg per day      Day 2    Time  Name of Medication Number of pills taken  Amount of Acetaminophen  Pain Level   Comments  AM  PM       AM PM       AM PM       AM PM       AM PM       AM PM       AM PM       AM PM       Total Daily amount of Acetaminophen Do not take more than  3,000 mg per day      Day 3    Time  Name of Medication Number of pills taken  Amount of Acetaminophen  Pain Level   Comments  AM PM       AM PM       AM PM       AM PM          AM PM       AM PM       AM PM       AM PM       Total Daily amount of Acetaminophen Do not take more than  3,000 mg per day      Day 4    Time  Name of Medication Number of pills taken  Amount of Acetaminophen  Pain Level   Comments  AM PM       AM PM       AM PM       AM PM       AM PM       AM PM       AM PM       AM PM       Total Daily amount of Acetaminophen Do not take more than  3,000 mg per day      Day 5    Time  Name of Medication Number of pills taken  Amount of Acetaminophen  Pain Level   Comments  AM PM       AM PM       AM PM       AM PM       AM PM       AM PM       AM PM         AM PM       Total Daily amount of Acetaminophen Do not take more than  3,000 mg per day       Day 6    Time  Name of Medication Number of pills taken  Amount of Acetaminophen  Pain Level  Comments  AM PM       AM PM       AM PM       AM PM       AM PM       AM PM       AM PM       AM PM       Total Daily amount of Acetaminophen Do not take more than  3,000 mg per day      Day 7    Time  Name of Medication Number of pills taken  Amount of Acetaminophen  Pain Level   Comments  AM PM       AM PM       AM PM       AM PM       AM PM       AM PM       AM PM       AM PM       Total Daily amount of Acetaminophen Do not take more than  3,000 mg per day        For additional information about how and where to safely dispose of unused opioid medications - https://www.morepowerfulnc.org  Disclaimer: This document contains information and/or instructional materials adapted from [facility]  for the typical patient with your condition. It does not replace medical advice from your health care provider because your experience may differ from that of the typical patient. Talk to your health care provider if you have any questions about this document, your condition or your treatment plan. Adapted from [facility]  

## 2020-01-04 NOTE — Progress Notes (Signed)
Pt off unit via stretcher with OR staff. Ambulatory, A&O x4 no s/s of acute distress noted.

## 2020-01-04 NOTE — Transfer of Care (Signed)
Immediate Anesthesia Transfer of Care Note  Patient: Daniel Cooper  Procedure(s) Performed: APPENDECTOMY LAPAROSCOPIC (N/A Abdomen)  Patient Location: PACU  Anesthesia Type:General  Level of Consciousness: awake, alert  and patient cooperative  Airway & Oxygen Therapy: Patient Spontanous Breathing and Patient connected to face mask oxygen  Post-op Assessment: Report given to RN and Post -op Vital signs reviewed and stable  Post vital signs: Reviewed and stable  Last Vitals:  Vitals Value Taken Time  BP 134/70 01/04/20 1207  Temp    Pulse 98 01/04/20 1208  Resp 16 01/04/20 1208  SpO2 100 % 01/04/20 1208  Vitals shown include unvalidated device data.  Last Pain:  Vitals:   01/04/20 0927  TempSrc:   PainSc: 5       Patients Stated Pain Goal: 5 (01/04/20 0927)  Complications: No apparent anesthesia complications

## 2020-01-04 NOTE — Anesthesia Procedure Notes (Addendum)
Procedure Name: Intubation Date/Time: 01/04/2020 10:54 AM Performed by: West Pugh, CRNA Pre-anesthesia Checklist: Patient identified, Emergency Drugs available, Suction available, Patient being monitored and Timeout performed Patient Re-evaluated:Patient Re-evaluated prior to induction Oxygen Delivery Method: Circle system utilized Preoxygenation: Pre-oxygenation with 100% oxygen Ventilation: Mask ventilation without difficulty and Oral airway inserted - appropriate to patient size Laryngoscope Size: Mac and 4 Grade View: Grade I Tube type: Oral Tube size: 7.5 mm Number of attempts: 1 Airway Equipment and Method: Stylet Placement Confirmation: ETT inserted through vocal cords under direct vision,  positive ETCO2,  CO2 detector and breath sounds checked- equal and bilateral Secured at: 22 cm Tube secured with: Tape Dental Injury: Teeth and Oropharynx as per pre-operative assessment

## 2020-01-04 NOTE — ED Provider Notes (Signed)
Patient transfer from [place] with abdominal pain and appendicitis on CT scan.  He is resting comfortably.   He denies any need for pain medicine or nausea medicine. He received antibiotics prior to transfer.   Dr. Magnus Ivan of surgery aware of patient arrival.    Glynn Octave, MD 01/04/20 [phone]

## 2020-01-04 NOTE — Progress Notes (Signed)
Subjective No acute events. Still with fevers and RLQ pain  Objective: Vital signs in last 24 hours: Temp:  [98.3 F (36.8 C)-101.5 F (38.6 C)] 99.8 F (37.7 C) (05/19 0900) Pulse Rate:  [69-108] 86 (05/19 0900) Resp:  [14-19] 16 (05/19 0900) BP: (148-168)/(73-93) 148/78 (05/19 0900) SpO2:  [96 %-99 %] 98 % (05/19 0900) Weight:  [77.1 kg] 77.1 kg (05/18 2021) Last BM Date: 01/02/20  Intake/Output from previous day: 05/18 0701 - 05/19 0700 In: 1315 [I.V.:188.7; IV Piggyback:1126.3] Out: -  Intake/Output this shift: No intake/output data recorded.  Gen: NAD, comfortable CV: RRR Pulm: Normal work of breathing Abd: Soft, ttp in RLQ; no rebound/guarding per se. Not significantly distended Ext: SCDs in place  Lab Results: CBC  Recent Labs    01/03/20 2112 01/04/20 0520  WBC 16.9* 18.1*  HGB 18.2* 16.7  HCT 50.7 48.3  PLT 213 189   BMET Recent Labs    01/03/20 2112  NA 136  K 3.8  CL 98  CO2 26  GLUCOSE 132*  BUN 13  CREATININE 1.00  CALCIUM 9.3   PT/INR No results for input(s): LABPROT, INR in the last 72 hours. ABG No results for input(s): PHART, HCO3 in the last 72 hours.  Invalid input(s): PCO2, PO2  Studies/Results:  Anti-infectives: Anti-infectives (From admission, onward)   Start     Dose/Rate Route Frequency Ordered Stop   01/04/20 0015  [MAR Hold]  piperacillin-tazobactam (ZOSYN) IVPB 3.375 g     (MAR Hold since Wed 01/04/2020 at 0909.Hold Reason: Transfer to a Procedural area.)   3.375 g 12.5 mL/hr over 240 Minutes Intravenous Every 8 hours 01/04/20 0003     01/03/20 2245  cefTRIAXone (ROCEPHIN) 2 g in sodium chloride 0.9 % 100 mL IVPB     2 g 200 mL/hr over 30 Minutes Intravenous  Once 01/03/20 2231 01/03/20 2357   01/03/20 2245  metroNIDAZOLE (FLAGYL) IVPB 500 mg     500 mg 100 mL/hr over 60 Minutes Intravenous  Once 01/03/20 2231 01/04/20 0221       Assessment/Plan: Patient Active Problem List   Diagnosis Date Noted  .  Appendicitis 01/04/2020  . Seasonal and perennial allergic rhinitis 09/14/2017  . Migraine 09/14/2017  . Gastroesophageal reflux disease 09/14/2017  . TTH (tension-type headache) 02/17/2017  . Recurrent sinus infections 01/01/2017  . Frequent headaches 01/01/2017  . Health maintenance examination 01/23/2015  . Skin rash 01/23/2015  . Non-traumatic rhabdomyolysis 09/07/2014  . Non-seasonal allergic rhinitis due to pollen 11/28/2010   Daniel Cooper is a very pleasant 78yoM with acute appendicitis - no clear evidence of perforation/abscess on imaging  -The anatomy and physiology of the GI tract was discussed at length with the patient. The pathophysiology of appendicitis was discussed at length as well. -I discussed options moving forward for treatment including observation with IV abx vs surgery. We discussed that with antibiotics alone, there is reasonable success in managing appendicitis without needing an operation. We discussed risks of recurrence at 68yrs being as high as 40% in some studies (APPAC trial in cases of acute uncomplicated appendicitis - exclusions being perforation, abscess, suspected malignancy, appenddicolith, age <18 or >60, peritonitis, serious comorbidities). We discussed surgical procedure including laparoscopic and potential open techniques. We discussed the material risks (including, but not limited to, pain, bleeding, infection, scarring, need for blood transfusion, damage to surrounding structures- blood vessels/nerves/viscus/organs, damage to ureter/bladder, urine leak, leak from staple line, need for additional procedures, need for stoma which may be permanent, hernia,  recurrence although quite low, pneumonia, heart attack, stroke, death) benefits and alternatives to surgery were discussed at length. The patient's questions were answered to his satisfaction, he voiced understanding and elected to proceed with surgery. Additionally, we discussed typical postoperative  expectations and the recovery process.   LOS: 0 days   Stephanie Coup. Cliffton Asters, M.D. [facility] Use [email or web address] to contact on call provider

## 2020-01-04 NOTE — H&P (Signed)
Daniel Cooper is an 34 y.o. male.   Chief Complaint: RLQ abdominal pain HPI: This is a 34 year old gentleman who presents with abdominal pain and diarrhea.  He reports that the pain started on Tuesday morning.  He did have several episodes of emesis.  He has had decreased appetite.  The pain is sharp and moderate to severe.  [facility] he underwent a CT scan showing acute appendicitis.  He was then transferred to [facility].  He has no previous history of abdominal discomfort.  He has no previous history of surgery on his abdomen.  Past Medical History:  Diagnosis Date  . History of rhabdomyolysis 08/2014   after intense workout  . Seasonal allergies   . VSD (ventricular septal defect)    told closed    Past Surgical History:  Procedure Laterality Date  . TONSILLECTOMY      Family History  Problem Relation Age of Onset  . Stroke Maternal Uncle 40  . Prostate cancer Paternal Grandfather 77  . Coronary artery disease Maternal Grandmother        CABG; +smoker  . Hypertension Other        some in family   Social History:  reports that he has never smoked. He has never used smokeless tobacco. He reports current alcohol use. He reports that he does not use drugs.  Allergies: No Known Allergies  Medications Prior to Admission  Medication Sig Dispense Refill  . cetirizine (ZYRTEC) 10 MG tablet Take 10 mg by mouth daily.    . Multiple Vitamins-Minerals (MULTIVITAMIN ADULT PO) Take by mouth daily.    . Omega-3 Fatty Acids (FISH OIL PO) Take 1 capsule by mouth daily.    . Needle, Disp, 27G X 1" MISC 0.5 mLs by Does not apply route once a week. 50 each 6    Results for orders placed or performed during the hospital encounter of 01/03/20 (from the past 48 hour(s))  Lipase, blood     Status: None   Collection Time: 01/03/20  9:12 PM  Result Value Ref Range   Lipase 29 11 - 51 U/L    Comment: Performed at [facility], [address] [place], [place] [address]  Comprehensive metabolic panel     Status: Abnormal   Collection Time: 01/03/20  9:12 PM  Result Value Ref Range   Sodium 136 135 - 145 mmol/L   Potassium 3.8 3.5 - 5.1 mmol/L   Chloride 98 98 - 111 mmol/L   CO2 26 22 - 32 mmol/L   Glucose, Bld 132 (H) 70 - 99 mg/dL    Comment: Glucose reference range applies only to samples taken after fasting for at least 8 hours.   BUN 13 6 - 20 mg/dL   Creatinine, Ser 4.28 0.61 - 1.24 mg/dL   Calcium 9.3 8.9 - 76.8 mg/dL   Total Protein 8.1 6.5 - 8.1 g/dL   Albumin 5.0 3.5 - 5.0 g/dL   AST 32 15 - 41 U/L   ALT 59 (H) 0 - 44 U/L   Alkaline Phosphatase 52 38 - 126 U/L   Total Bilirubin 2.7 (H) 0.3 - 1.2 mg/dL   GFR calc non Af Amer >60 >60 mL/min   GFR calc Af Amer >60 >60 mL/min   Anion gap 12 5 - 15    Comment: Performed at [facility], [address] [place], [place] [address]  CBC  Status: Abnormal   Collection Time: 01/03/20  9:12 PM  Result Value Ref Range   WBC 16.9 (H) 4.0 - 10.5 K/uL   RBC 6.00 (H) 4.22 - 5.81 MIL/uL   Hemoglobin 18.2 (H) 13.0 - 17.0 g/dL   HCT 50.7 39.0 - 52.0 %   MCV 84.5 80.0 - 100.0 fL   MCH 30.3 26.0 - 34.0 pg   MCHC 35.9 30.0 - 36.0 g/dL   RDW 11.9 11.5 - 15.5 %   Platelets 213 150 - 400 K/uL   nRBC 0.0 0.0 - 0.2 %    Comment: Performed at [facility], [address] [place], [place] [address]  SARS Coronavirus 2 by RT PCR (hospital order, performed in [facility] hospital lab) Nasopharyngeal Nasopharyngeal Swab     Status: None   Collection Time: 01/03/20 10:49 PM   Specimen: Nasopharyngeal Swab  Result Value Ref Range   SARS Coronavirus 2 NEGATIVE NEGATIVE    Comment: (NOTE) SARS-CoV-2 target nucleic acids are NOT DETECTED. The SARS-CoV-2 RNA is generally detectable in upper and lower respiratory specimens during the acute phase of infection. The lowest concentration of SARS-CoV-2 viral copies this assay can detect is 250 copies / mL. A  negative result does not preclude SARS-CoV-2 infection and should not be used as the sole basis for treatment or other patient management decisions.  A negative result may occur with improper specimen collection / handling, submission of specimen other than nasopharyngeal swab, presence of viral mutation(s) within the areas targeted by this assay, and inadequate number of viral copies (<250 copies / mL). A negative result must be combined with clinical observations, patient history, and epidemiological information. Fact Sheet for Patients:   StrictlyIdeas.no Fact Sheet for Healthcare Providers: BankingDealers.co.za This test is not yet approved or cleared  by the [place] FDA and has been authorized for detection and/or diagnosis of SARS-CoV-2 by FDA under an Emergency Use Authorization (EUA).  This EUA will remain in effect (meaning this test can be used) for the duration of the COVID-19 declaration under Section 564(b)(1) of the Act, 21 U.S.C. section 360bbb-3(b)(1), unless the authorization is terminated or revoked sooner. Performed at [facility], [address] [place], [place] [address]   CBC     Status: Abnormal   Collection Time: 01/04/20  5:20 AM  Result Value Ref Range   WBC 18.1 (H) 4.0 - 10.5 K/uL   RBC 5.50 4.22 - 5.81 MIL/uL   Hemoglobin 16.7 13.0 - 17.0 g/dL   HCT 48.3 39.0 - 52.0 %   MCV 87.8 80.0 - 100.0 fL   MCH 30.4 26.0 - 34.0 pg   MCHC 34.6 30.0 - 36.0 g/dL   RDW 11.9 11.5 - 15.5 %   Platelets 189 150 - 400 K/uL   nRBC 0.0 0.0 - 0.2 %    Comment: Performed at [facility], [address], [place], [place] [address]   CT ABDOMEN PELVIS W CONTRAST  Result Date: 01/03/2020 CLINICAL DATA:  34 year old male with right upper quadrant abdominal pain, vomiting, diarrhea last night. EXAM: CT ABDOMEN AND PELVIS WITH CONTRAST TECHNIQUE: Multidetector CT imaging of the abdomen and pelvis  was performed using the standard protocol following bolus administration of intravenous contrast. CONTRAST:  159mL OMNIPAQUE IOHEXOL 300 MG/ML  SOLN COMPARISON:  None. FINDINGS: Lower chest: Negative. Hepatobiliary: Negative. Pancreas: Negative. Spleen: Negative. Adrenals/Urinary Tract: Normal adrenal glands. Symmetric and normal renal enhancement. No hydronephrosis. Decompressed proximal ureters. The abnormal  distal appendix abuts the right lateral wall of the urinary bladder on series 2, image 62. The bladder is otherwise unremarkable. Stomach/Bowel: Mostly decompressed large bowel from the distal transverse colon to the rectum. Mild retained stool in the sigmoid. Oral contrast has reached the ascending colon. The terminal ileum and ileocecal valve are identified on coronal image 41. Just caudal to that an abnormal appendix arises from the cecum on coronal image 46 and tracks posteriorly and inferiorly into the right hemipelvis (series 5, image 55). Appendix: Location: Medial to the cecum, tracking to the right lateral pelvis. Diameter: Up to 17 mm Appendicolith: Positive, about 16 mm in length on series 2, image 53. Mucosal hyper-enhancement: Negative. Extraluminal gas: None. Periappendiceal collection: Inflammation, with only small volume reactive appearing fluid primarily in the pelvis. Terminal ileum and other distal small bowel loops are decompressed. No dilated small bowel. Negative stomach. No free air. Vascular/Lymphatic: Major arterial structures appear patent and normal. Portal venous system is patent. Central venous structures appear patent. Reproductive: Negative. Other: Small volume of mildly complex free fluid in the pelvis (series 2, image 63). Musculoskeletal: No acute osseous abnormality identified. IMPRESSION: Positive for Acute Appendicitis. Appendicolith with dilated and hypo-enhancing downstream appendix which tracks into the right hemipelvis. Small volume mildly complex fluid in the pelvis  but no organized abscess. Electronically Signed   By: Odessa Fleming M.D.   On: 01/03/2020 22:28    Review of Systems  Constitutional: Negative for chills and fever.  Respiratory: Negative for shortness of breath.   Cardiovascular: Negative for chest pain.  Gastrointestinal: Positive for abdominal pain, diarrhea, nausea and vomiting. Negative for abdominal distention.  Genitourinary: Negative for dysuria.  All other systems reviewed and are negative.   Blood pressure (!) 162/88, pulse 72, temperature 99 F (37.2 C), temperature source Oral, resp. rate 16, height 5\' 8"  (1.727 m), weight 77.1 kg, SpO2 98 %. Physical Exam  Constitutional: He appears well-developed and well-nourished.  Uncomfortable appearing  HENT:  Head: Normocephalic and atraumatic.  Right Ear: External ear normal.  Left Ear: External ear normal.  Nose: Nose normal.  Eyes: Pupils are equal, round, and reactive to light. No scleral icterus.  Cardiovascular: Normal rate, regular rhythm and normal heart sounds.  Respiratory: Effort normal and breath sounds normal. No respiratory distress.  GI: Soft. He exhibits no distension. There is abdominal tenderness. There is guarding.  There is tenderness with guarding in the right lower quadrant.  Musculoskeletal:        General: No deformity. Normal range of motion.     Cervical back: Normal range of motion.  Skin: Skin is warm and dry. No erythema. No pallor.  Psychiatric: His behavior is normal. Judgment normal.     Assessment/Plan Acute appendicitis  I reviewed his CAT scan of the abdomen and pelvis.  I reviewed his laboratory data.  I discussed the diagnosis with him.  He does have acute appendicitis.  There is a small amount of complex fluid I cannot rule out there is perforation.  There is no definite abscess.  Appendectomy is recommended.  I discussed this with him in detail including the procedure and its risks.  This will be performed by our acute care surgeon this week  Dr. .  I will discuss this with him.  Angelena Form, MD 01/04/2020, 5:49 AM

## 2020-01-04 NOTE — Anesthesia Postprocedure Evaluation (Signed)
Anesthesia Post Note  Patient: Daniel Cooper  Procedure(s) Performed: APPENDECTOMY LAPAROSCOPIC (N/A Abdomen)     Patient location during evaluation: PACU Anesthesia Type: General Level of consciousness: awake and alert Pain management: pain level controlled Vital Signs Assessment: post-procedure vital signs reviewed and stable Respiratory status: spontaneous breathing, nonlabored ventilation and respiratory function stable Cardiovascular status: blood pressure returned to baseline and stable Postop Assessment: no apparent nausea or vomiting Anesthetic complications: no    Last Vitals:  Vitals:   01/04/20 1300 01/04/20 1332  BP: 132/77 (!) 148/92  Pulse: 100 96  Resp: 14 16  Temp: 36.9 C 37.1 C  SpO2: 98% 97%    Last Pain:  Vitals:   01/04/20 1332  TempSrc: Oral  PainSc:                  [name]

## 2020-01-04 NOTE — Anesthesia Preprocedure Evaluation (Signed)
Anesthesia Evaluation  Patient identified by MRN, date of birth, ID band Patient awake    Reviewed: Allergy & Precautions, NPO status , Patient's Chart, lab work & pertinent test results  History of Anesthesia Complications Negative for: history of anesthetic complications  Airway Mallampati: II  TM Distance: >3 FB Neck ROM: Full    Dental  (+) Teeth Intact   Pulmonary neg pulmonary ROS,    Pulmonary exam normal        Cardiovascular negative cardio ROS Normal cardiovascular exam     Neuro/Psych negative neurological ROS  negative psych ROS   GI/Hepatic Neg liver ROS, GERD  ,Acute appendicitis   Endo/Other  negative endocrine ROS  Renal/GU negative Renal ROS  negative genitourinary   Musculoskeletal negative musculoskeletal ROS (+)   Abdominal   Peds  Hematology negative hematology ROS (+)   Anesthesia Other Findings   Reproductive/Obstetrics                             Anesthesia Physical Anesthesia Plan  ASA: II and emergent  Anesthesia Plan: General   Post-op Pain Management:    Induction: Intravenous  PONV Risk Score and Plan: 3 and Ondansetron, Dexamethasone, Treatment may vary due to age or medical condition and Midazolam  Airway Management Planned: Oral ETT  Additional Equipment: None  Intra-op Plan:   Post-operative Plan: Extubation in OR  Informed Consent: I have reviewed the patients History and Physical, chart, labs and discussed the procedure including the risks, benefits and alternatives for the proposed anesthesia with the patient or authorized representative who has indicated his/her understanding and acceptance.     Dental advisory given  Plan Discussed with:   Anesthesia Plan Comments:         Anesthesia Quick Evaluation

## 2020-01-04 NOTE — Progress Notes (Signed)
Pt arrives to unit via stretcher. Alert, ambulatory to restroom. Abdominal Incisions clean dry intact. Family at bedside. Safety measures in place.

## 2020-01-05 LAB — CBC
HCT: 40.7 % (ref 39.0–52.0)
Hemoglobin: 13.8 g/dL (ref 13.0–17.0)
MCH: 30.3 pg (ref 26.0–34.0)
MCHC: 33.9 g/dL (ref 30.0–36.0)
MCV: 89.3 fL (ref 80.0–100.0)
Platelets: 144 10*3/uL — ABNORMAL LOW (ref 150–400)
RBC: 4.56 MIL/uL (ref 4.22–5.81)
RDW: 12 % (ref 11.5–15.5)
WBC: 14.3 10*3/uL — ABNORMAL HIGH (ref 4.0–10.5)
nRBC: 0 % (ref 0.0–0.2)

## 2020-01-05 LAB — BASIC METABOLIC PANEL
Anion gap: 9 (ref 5–15)
BUN: 14 mg/dL (ref 6–20)
CO2: 23 mmol/L (ref 22–32)
Calcium: 8 mg/dL — ABNORMAL LOW (ref 8.9–10.3)
Chloride: 104 mmol/L (ref 98–111)
Creatinine, Ser: 0.89 mg/dL (ref 0.61–1.24)
GFR calc Af Amer: >60 mL/min (ref >60)
GFR calc non Af Amer: >60 mL/min (ref >60)
Glucose, Bld: 121 mg/dL — ABNORMAL HIGH (ref 70–99)
Potassium: 4 mmol/L (ref 3.5–5.1)
Sodium: 136 mmol/L (ref 135–145)

## 2020-01-05 LAB — SURGICAL PATHOLOGY

## 2020-01-05 MED ORDER — CIPROFLOXACIN HCL 500 MG PO TABS: 500.0000 mg | ORAL_TABLET | Freq: Two times a day (BID) | ORAL | 0 refills | Status: AC

## 2020-01-05 MED ORDER — ACETAMINOPHEN 500 MG PO TABS
1000.0000 mg | ORAL_TABLET | Freq: Four times a day (QID) | ORAL | Status: DC
  Administered 2020-01-05: 1000 mg via ORAL
  Filled 2020-01-05: qty 2

## 2020-01-05 MED ORDER — GABAPENTIN 300 MG PO CAPS
300.0000 mg | ORAL_CAPSULE | Freq: Two times a day (BID) | ORAL | Status: DC
  Administered 2020-01-05: 300 mg via ORAL
  Filled 2020-01-05: qty 1

## 2020-01-05 MED ORDER — DOCUSATE SODIUM 100 MG PO CAPS
100.0000 mg | ORAL_CAPSULE | Freq: Two times a day (BID) | ORAL | Status: DC
  Filled 2020-01-05: qty 1

## 2020-01-05 MED ORDER — METHOCARBAMOL 500 MG PO TABS: 500.0000 mg | ORAL_TABLET | Freq: Four times a day (QID) | ORAL | Status: DC | PRN

## 2020-01-05 MED ORDER — OXYCODONE HCL 5 MG PO TABS: 5.0000 mg | ORAL_TABLET | ORAL | Status: DC | PRN

## 2020-01-05 MED ORDER — POLYETHYLENE GLYCOL 3350 17 G PO PACK: 17.0000 g | PACK | Freq: Every day | ORAL | Status: DC | PRN

## 2020-01-05 MED ORDER — OXYCODONE HCL 5 MG PO TABS: 5.0000 mg | ORAL_TABLET | Freq: Four times a day (QID) | ORAL | 0 refills | Status: DC | PRN

## 2020-01-05 MED ORDER — SIMETHICONE 80 MG PO CHEW: 40.0000 mg | CHEWABLE_TABLET | Freq: Four times a day (QID) | ORAL | Status: DC | PRN

## 2020-01-05 MED ORDER — ACETAMINOPHEN 500 MG PO TABS: 1000.0000 mg | ORAL_TABLET | Freq: Four times a day (QID) | ORAL | Status: AC | PRN

## 2020-01-05 MED ORDER — METRONIDAZOLE 500 MG PO TABS: 500.0000 mg | ORAL_TABLET | Freq: Two times a day (BID) | ORAL | 0 refills | Status: DC

## 2020-01-05 MED ORDER — IBUPROFEN 200 MG PO TABS: 600.0000 mg | ORAL_TABLET | Freq: Four times a day (QID) | ORAL | Status: AC | PRN

## 2020-01-05 MED ORDER — IBUPROFEN 200 MG PO TABS: 600.0000 mg | ORAL_TABLET | Freq: Four times a day (QID) | ORAL | Status: DC | PRN

## 2020-01-05 NOTE — Discharge Summary (Signed)
[facility] Discharge Summary   Patient ID: Daniel Cooper MRN: [id number] DOB/AGE: Sep 18, 1985 34 y.o.  Admit date: 01/03/2020 Discharge date: 01/05/2020  Admitting Diagnosis: Acute appendicitis  Discharge Diagnosis Acute gangrenous appendicitis   Consultants None   Imaging: CT ABDOMEN PELVIS W CONTRAST  Result Date: 01/03/2020 CLINICAL DATA:  34 year old male with right upper quadrant abdominal pain, vomiting, diarrhea last night. EXAM: CT ABDOMEN AND PELVIS WITH CONTRAST TECHNIQUE: Multidetector CT imaging of the abdomen and pelvis was performed using the standard protocol following bolus administration of intravenous contrast. CONTRAST:  126mL OMNIPAQUE IOHEXOL 300 MG/ML  SOLN COMPARISON:  None. FINDINGS: Lower chest: Negative. Hepatobiliary: Negative. Pancreas: Negative. Spleen: Negative. Adrenals/Urinary Tract: Normal adrenal glands. Symmetric and normal renal enhancement. No hydronephrosis. Decompressed proximal ureters. The abnormal distal appendix abuts the right lateral wall of the urinary bladder on series 2, image 62. The bladder is otherwise unremarkable. Stomach/Bowel: Mostly decompressed large bowel from the distal transverse colon to the rectum. Mild retained stool in the sigmoid. Oral contrast has reached the ascending colon. The terminal ileum and ileocecal valve are identified on coronal image 41. Just caudal to that an abnormal appendix arises from the cecum on coronal image 46 and tracks posteriorly and inferiorly into the right hemipelvis (series 5, image 55). Appendix: Location: Medial to the cecum, tracking to the right lateral pelvis. Diameter: Up to 17 mm Appendicolith: Positive, about 16 mm in length on series 2, image 53. Mucosal hyper-enhancement: Negative. Extraluminal gas: None. Periappendiceal collection: Inflammation, with only small volume reactive appearing fluid primarily in the pelvis. Terminal ileum and other distal small bowel loops are  decompressed. No dilated small bowel. Negative stomach. No free air. Vascular/Lymphatic: Major arterial structures appear patent and normal. Portal venous system is patent. Central venous structures appear patent. Reproductive: Negative. Other: Small volume of mildly complex free fluid in the pelvis (series 2, image 63). Musculoskeletal: No acute osseous abnormality identified. IMPRESSION: Positive for Acute Appendicitis. Appendicolith with dilated and hypo-enhancing downstream appendix which tracks into the right hemipelvis. Small volume mildly complex fluid in the pelvis but no organized abscess. Electronically Signed   By: Genevie Ann M.D.   On: 01/03/2020 22:28    Procedures Dr. Nadeen Landau (01/04/20) - Laparoscopic Appendectomy  Hospital Course:  Patient is a 34 year old male who presented to [facility] with abdominal pain.  Workup showed acute appendicitis. Patient was transferred to [facility].  Patient was admitted and underwent procedure listed above.  Tolerated procedure well and was transferred to the floor.  Diet was advanced as tolerated.  On POD#1, the patient was voiding well, tolerating diet, ambulating well, pain well controlled, vital signs stable, incisions c/d/i and felt stable for discharge home.  Patient will follow up in our office in 3 weeks and knows to call with questions or concerns.  He will call to confirm appointment date/time.    Physical Exam: General:  Alert, NAD, pleasant, comfortable Abd:  Soft, ND, mild tenderness, incisions C/D/I  I have personally looked this patient up in the [place] Controlled Substance Database and reviewed their medications.   Allergies as of 01/05/2020   No Known Allergies     Medication List    TAKE these medications   acetaminophen 500 MG tablet Commonly known as: TYLENOL Take 2 tablets (1,000 mg total) by mouth every 6 (six) hours as needed for mild pain or fever.   cetirizine 10 MG tablet Commonly known as: ZYRTEC Take 10 mg by mouth daily.  ciprofloxacin 500 MG tablet Commonly known as: Cipro Take 1 tablet (500 mg total) by mouth 2 (two) times daily for 7 days.   FISH OIL PO Take 1 capsule by mouth daily.   ibuprofen 200 MG tablet Commonly known as: ADVIL Take 3 tablets (600 mg total) by mouth every 6 (six) hours as needed (for mild pain not relieved by other medications.).   metroNIDAZOLE 500 MG tablet Commonly known as: Flagyl Take 1 tablet (500 mg total) by mouth 2 (two) times daily with a meal. DO NOT CONSUME ALCOHOL WHILE TAKING THIS MEDICATION.   MULTIVITAMIN ADULT PO Take by mouth daily.   Needle (Disp) 27G X 1" Misc 0.5 mLs by Does not apply route once a week.   oxyCODONE 5 MG immediate release tablet Commonly known as: Oxy IR/ROXICODONE Take 1-2 tablets (5-10 mg total) by mouth every 6 (six) hours as needed for moderate pain or severe pain.        Follow-up Information    Surgery, [facility]. Go on 01/26/2020.   Specialty: General Surgery Why: Follow up appointment scheduled for 1:30 PM. Please arrive 30 min prior to appointment time. Bring photo ID and insurance information.  Contact information: [address] [place] [address] [phone]           Signed: Wells Guiles, [facility] 01/05/2020, 9:21 AM Please see Amion for pager number during day hours 7:00am-4:30pm

## 2020-11-22 DIAGNOSIS — J301 Allergic rhinitis due to pollen: Secondary | ICD-10-CM

## 2020-11-22 NOTE — Progress Notes (Signed)
VIALS EXP 11-22-21 

## 2020-12-10 IMAGING — CT CT ABD-PELV W/ CM
2 of 4 series · 16 of 46 positions shown, 18 images · IV contrast (Omnipaque)
Comparison: None.

CLINICAL DATA: 33-year-old male with right upper quadrant abdominal
pain, vomiting, diarrhea last night.

EXAM:
CT ABDOMEN AND PELVIS WITH CONTRAST
TECHNIQUE: Multidetector CT imaging of the abdomen and pelvis was performed
using the standard protocol following bolus administration of
intravenous contrast.
CONTRAST:  100mL OMNIPAQUE IOHEXOL 300 MG/ML  SOLN

[Series 2: axial st · axial · 0.73mm/px · z∈[-718,-333]mm · 13 of 85 slices shown, 15 images]
[im 4/85  soft-tissue]
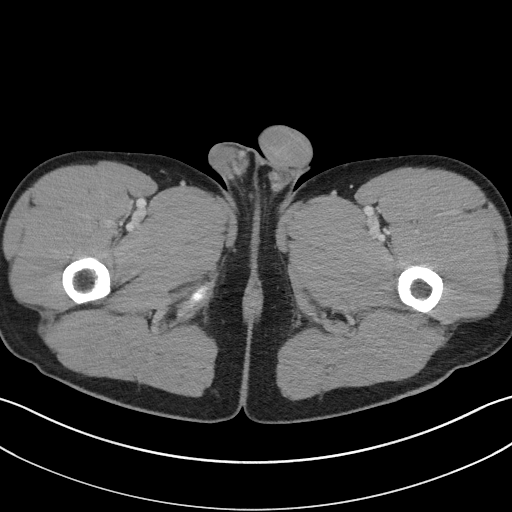
[im 4/85  bone]
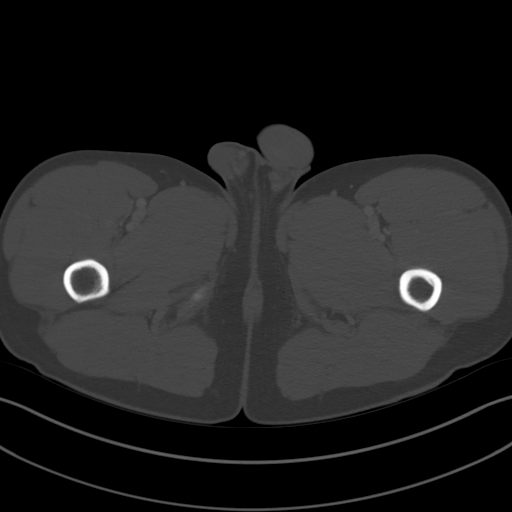
[im 11/85  soft-tissue]
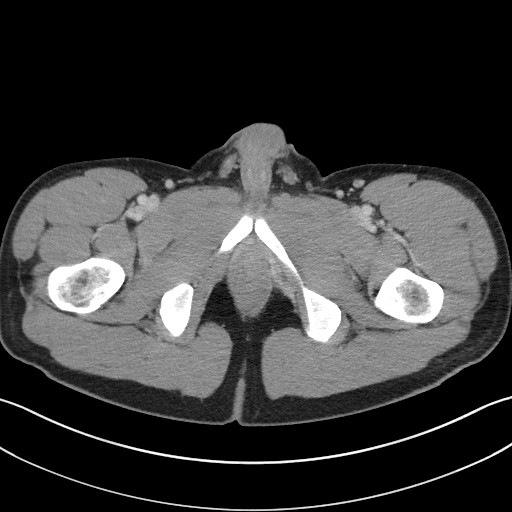
[im 18/85  soft-tissue]
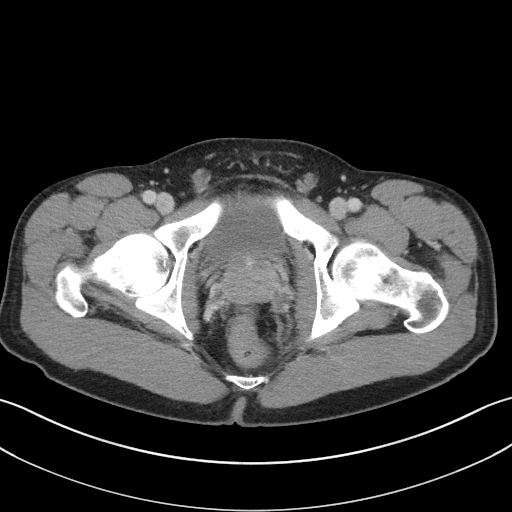
[im 25/85  soft-tissue]
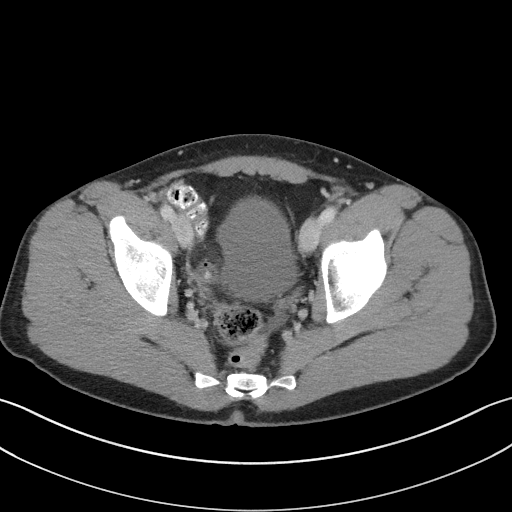
[im 29/85  soft-tissue]
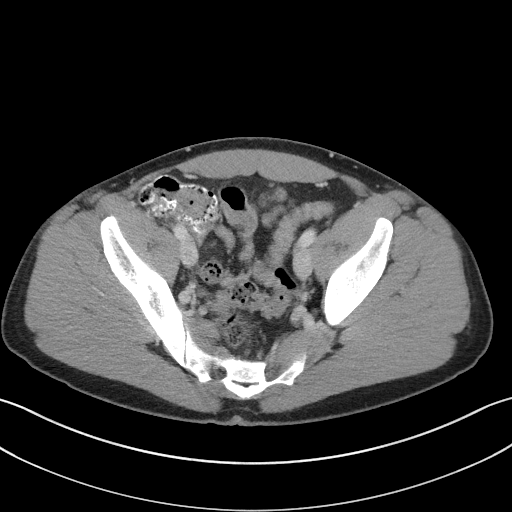
[im 36/85  soft-tissue]
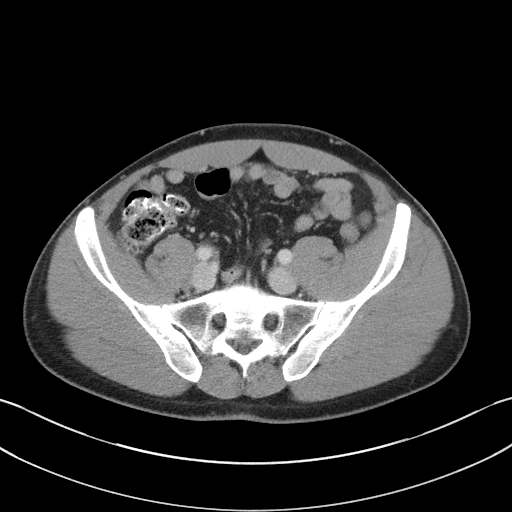
[im 43/85  soft-tissue]
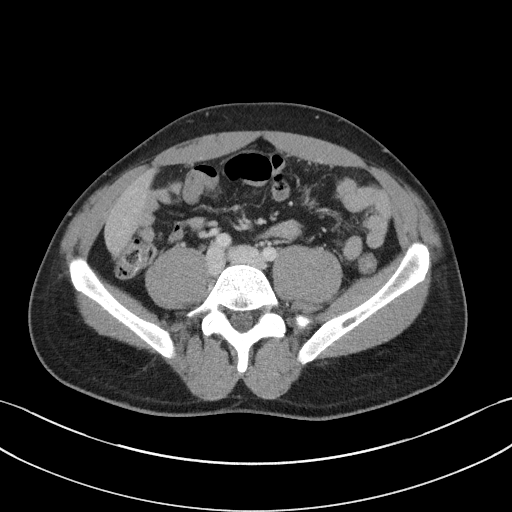
[im 50/85  soft-tissue]
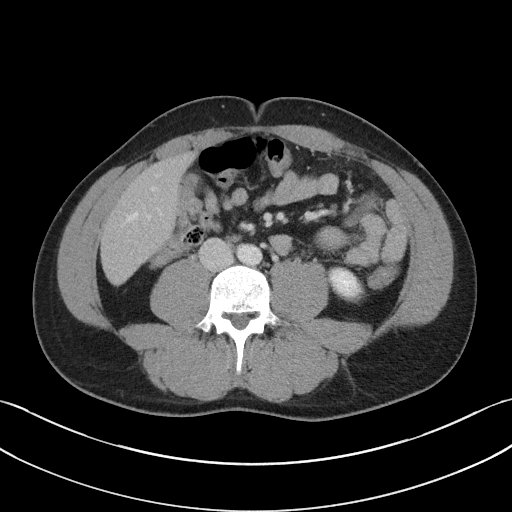
[im 57/85  soft-tissue]
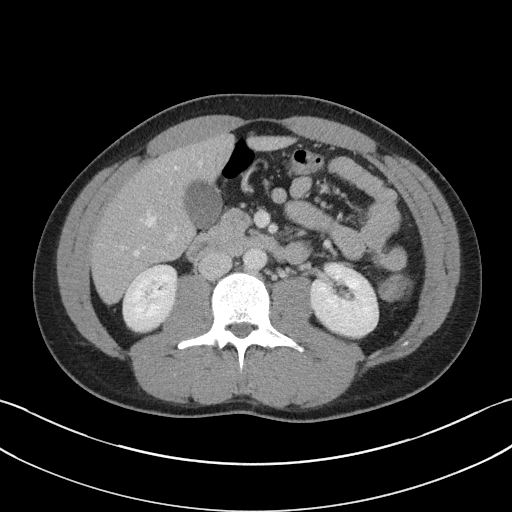
[im 57/85  bone]
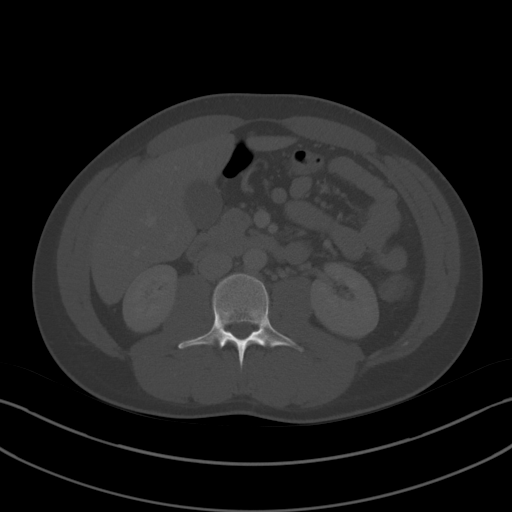
[im 60/85  soft-tissue]
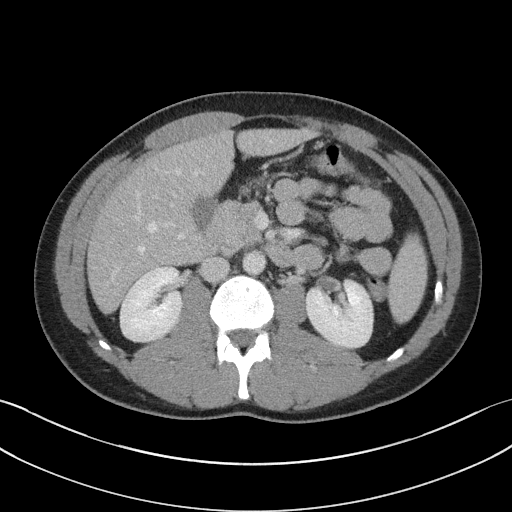
[im 67/85  soft-tissue]
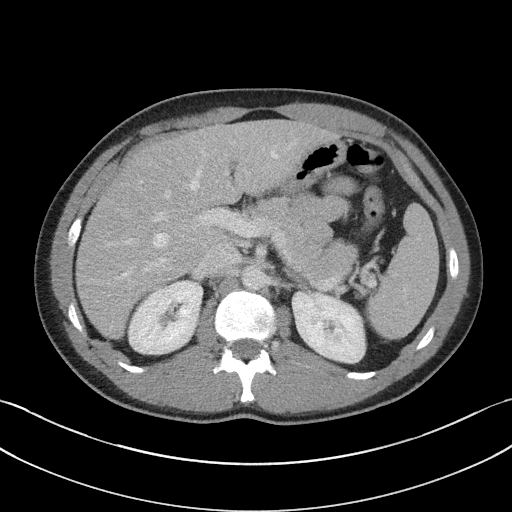
[im 74/85  soft-tissue]
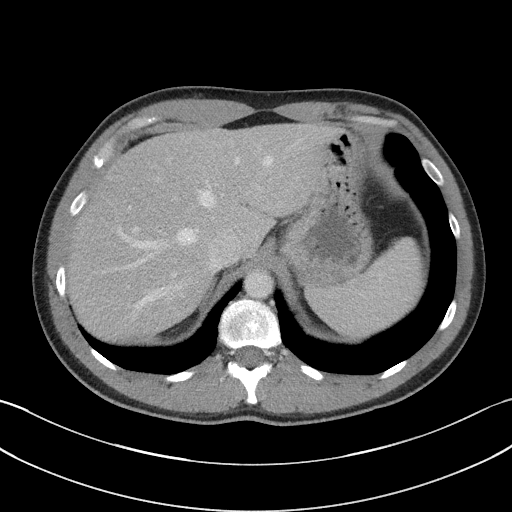
[im 81/85  soft-tissue]
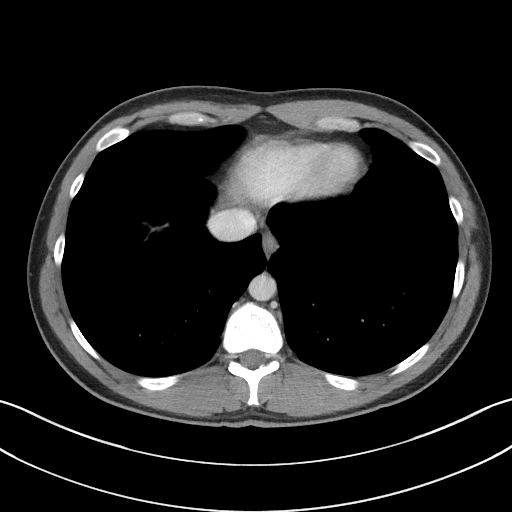

[Series 5: coronal st · coronal · 0.77mm/px · 3 of 101 slices shown]
[im 34/101  soft-tissue]
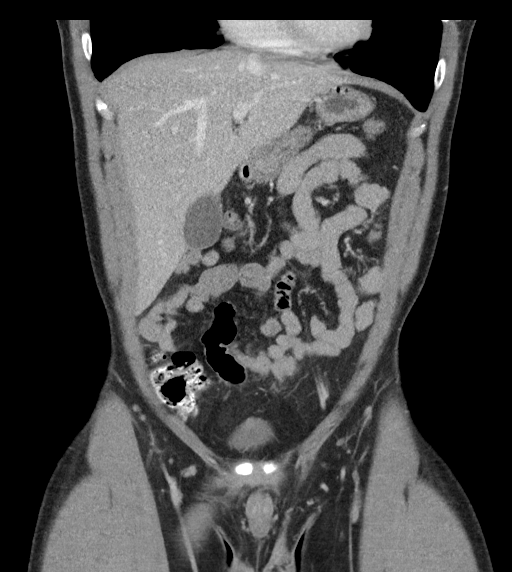
[im 45/101  soft-tissue]
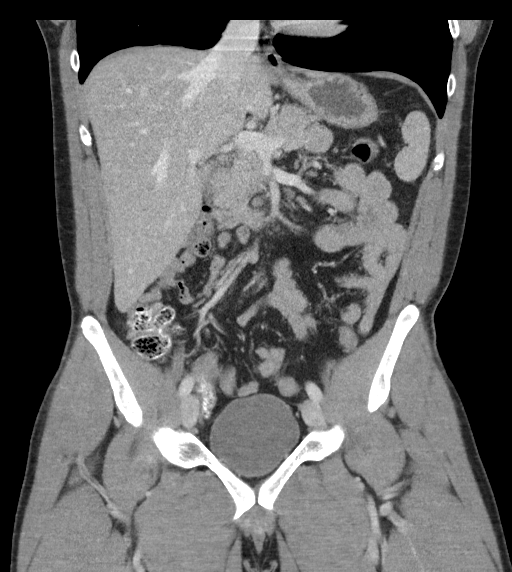
[im 56/101  soft-tissue]
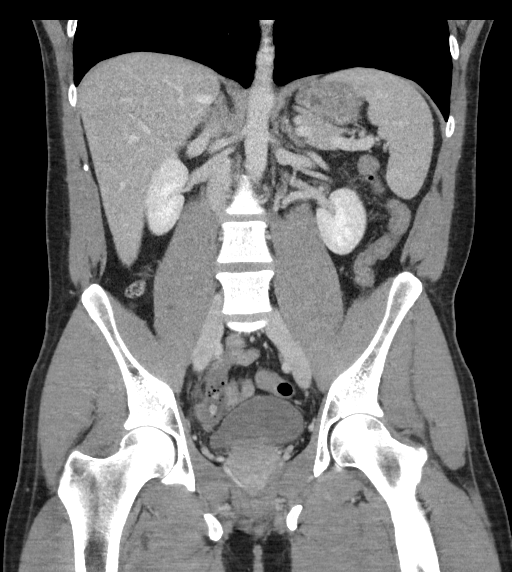

[16 of 46 positions shown; findings below may reference images not displayed]

FINDINGS: Lower chest: Negative.

Hepatobiliary: Negative.

Pancreas: Negative.

Spleen: Negative.

Adrenals/Urinary Tract: Normal adrenal glands.

Symmetric and normal renal enhancement. No hydronephrosis.
Decompressed proximal ureters. The abnormal distal appendix abuts
the right lateral wall of the urinary bladder on series 2, image 62.
The bladder is otherwise unremarkable.

Stomach/Bowel: Mostly decompressed large bowel from the distal
transverse colon to the rectum. Mild retained stool in the sigmoid.

Oral contrast has reached the ascending colon. The terminal ileum
and ileocecal valve are identified on coronal image 41. Just caudal
to that an abnormal appendix arises from the cecum on coronal image
46 and tracks posteriorly and inferiorly into the right hemipelvis
(series 5, image 55).

Appendix: Location: Medial to the cecum, tracking to the right
lateral pelvis.

Diameter: Up to 17 mm

Appendicolith: Positive, about 16 mm in length on series 2, image
53.

Mucosal hyper-enhancement: Negative.

Extraluminal gas: None.

Periappendiceal collection: Inflammation, with only small volume
reactive appearing fluid primarily in the pelvis.

Terminal ileum and other distal small bowel loops are decompressed.
No dilated small bowel. Negative stomach. No free air.

Vascular/Lymphatic: Major arterial structures appear patent and
normal. Portal venous system is patent. Central venous structures
appear patent.

Reproductive: Negative.

Other: Small volume of mildly complex free fluid in the pelvis
(series 2, image 63).

Musculoskeletal: No acute osseous abnormality identified.
IMPRESSION: Positive for Acute Appendicitis.
Appendicolith with dilated and hypo-enhancing downstream appendix
which tracks into the right hemipelvis.

Small volume mildly complex fluid in the pelvis but no organized
abscess.

## 2020-12-14 ENCOUNTER — Ambulatory Visit: Payer: Managed Care, Other (non HMO)

## 2020-12-17 ENCOUNTER — Ambulatory Visit (INDEPENDENT_AMBULATORY_CARE_PROVIDER_SITE_OTHER): Payer: Managed Care, Other (non HMO)

## 2020-12-17 ENCOUNTER — Other Ambulatory Visit: Payer: Self-pay

## 2020-12-17 DIAGNOSIS — J309 Allergic rhinitis, unspecified: Secondary | ICD-10-CM | POA: Diagnosis not present

## 2020-12-17 NOTE — Progress Notes (Signed)
Immunotherapy   Patient Details  Name: Daniel Cooper MRN: [id number] Date of Birth: 1986/05/05  12/17/2020  [name]  Patient here to pick up red # 1 vials 1:100 Pollens-Dm-Cat 0.025 cc given in (L) arm and Mold 0.025 cc (R) arm Both vials are to be Frozen at 0.25cc per Dr. Dellis Anes. Marylu Lund also texted [employer] to have her put Frozen at [employer] on [employer] paper work. Following schedule: A Red #1 once weekly freeze both vials at 0.25 cc per Dr. Dellis Anes.  Frequency:1 time per week Epi-Pen:Epi-Pen Available  Consent signed and patient instructions given. Vials expire 11/22/2021  Murray Hodgkins 12/17/2020, 11:29 AM

## 2021-09-10 ENCOUNTER — Other Ambulatory Visit: Payer: Self-pay

## 2021-09-10 ENCOUNTER — Ambulatory Visit: Payer: Managed Care, Other (non HMO) | Admitting: Allergy & Immunology

## 2021-09-10 ENCOUNTER — Encounter: Payer: Self-pay | Admitting: Allergy & Immunology

## 2021-09-10 VITALS — BP 116/80 | HR 69 | Temp 98.3°F | Resp 18 | Ht 69.0 in | Wt 184.4 lb

## 2021-09-10 DIAGNOSIS — J3089 Other allergic rhinitis: Secondary | ICD-10-CM

## 2021-09-10 DIAGNOSIS — J302 Other seasonal allergic rhinitis: Secondary | ICD-10-CM

## 2021-09-10 MED ORDER — EPINEPHRINE 0.3 MG/0.3ML IJ SOAJ: 0.3000 mg | Freq: Once | INTRAMUSCULAR | 2 refills | Status: AC

## 2021-09-10 NOTE — Progress Notes (Signed)
FOLLOW UP  Date of Service/Encounter:  09/10/21   Assessment:   Seasonal and perennial allergic rhinitis (trees, weeds, grasses, molds, dust mites and cat) - on allergen immunotherapy with maintenance reached 2019   Chronic migraines - resolved with changing to a dayshift   Nasal congestion - with marked improvement with allergen immunotherapy  Plan/Recommendations:   1. Seasonal and perennial allergic rhinitis - We will get some blood work to confirm any new allergies. - If this is normal, we will just go ahead and stick with the same from the previous testing. - EpiPen script refilled today. - Sample of Ryaltris provided today.   2. Return in about 1 year (around 09/10/2022).    Subjective:   Daniel Cooper is a 36 y.o. male presenting today for follow up of  Chief Complaint  Patient presents with   Follow-up    Daniel Cooper has a history of the following: Patient Active Problem List   Diagnosis Date Noted   Appendicitis 01/04/2020   Seasonal and perennial allergic rhinitis 09/14/2017   Migraine 09/14/2017   Gastroesophageal reflux disease 09/14/2017   TTH (tension-type headache) 02/17/2017   Recurrent sinus infections 01/01/2017   Frequent headaches 01/01/2017   Health maintenance examination 01/23/2015   Skin rash 01/23/2015   Non-traumatic rhabdomyolysis 09/07/2014   Non-seasonal allergic rhinitis due to pollen 11/28/2010    History obtained from: chart review and patient.  Daniel Cooper is a 36 y.o. male presenting for a follow up visit. He was last seen in April 2021. At that time, he was doing well on allergen immunotherapy. Migraines were under good control with changing to a daytime.   Since the last visit, he has done well.   He was getting consistent with his shots. He is doing weekly right now.  However, I texted his fiance and after he left and he is only getting a shot every 10 to 13 days. He did have some episodes where he was sick and this  interrupted the advancing.   Migraines are gone now since getting off the night shift. He has not had any sinus infections. He does take cetirizine every day and feels it if he does not take it.   He has two dogs Daniel Cooper and Daniel Cooper. Daniel Cooper came to the relationship, but then got Daniel Cooper after they were both together.   He still feels that they are helping. He has not had them in a while but they are getting to him now. He made 10 gallons of brun[place] stew. They had a good time with the bonfire, but it did make his allergies much worse.   Daniel Cooper is on allergen immunotherapy but he has not had it in a while. He receives two injections. Immunotherapy script #1 contains molds. He currently receives 0.5 mL of the RED vial (1/100). Immunotherapy script #2 contains trees, weeds, grasses, molds, dust mites and cat. He currently receives 0.57mL of the RED vial (1/100). He started shots November of 2018 and reached maintenance sometime in 2019. He receives his injections at an outside facility with our dosing parameters.   His fiancee continues to work as a Tour manager. She just restarted a couple of months ago. She might extend for longer. She is able to rent a house in the [place] area but she also comes back when she has an extended time period where she can get away.   They are getting married in October 2023.  It is at at the [employer] in [place].   He continues to work the day shift with [employer]. He enjoys the job.   Otherwise, there have been no changes to his past medical history, surgical history, family history, or social history.    Review of Systems  Constitutional: Negative.  Negative for chills, fever, malaise/fatigue and weight loss.  HENT: Negative.  Negative for congestion, ear discharge, ear pain and sinus pain.   Eyes:  Negative for pain, discharge and redness.  Respiratory:  Negative for cough, sputum production, shortness of breath and wheezing.   Cardiovascular:  Negative.  Negative for chest pain and palpitations.  Gastrointestinal:  Negative for abdominal pain, constipation, diarrhea, heartburn, nausea and vomiting.  Skin: Negative.  Negative for itching and rash.  Neurological:  Negative for dizziness and headaches.  Endo/Heme/Allergies:  Negative for environmental allergies. Does not bruise/bleed easily.      Objective:   Blood pressure 116/80, pulse 69, temperature 98.3 F (36.8 C), temperature source Temporal, resp. rate 18, height 5\' 9"  (1.753 m), weight 184 lb 6.4 oz (83.6 kg), SpO2 98 %. Body mass index is 27.23 kg/m.   Physical Exam:  Physical Exam Vitals reviewed.  Constitutional:      Appearance: He is well-developed.  HENT:     Head: Normocephalic and atraumatic.     Right Ear: Tympanic membrane, ear canal and external ear normal.     Left Ear: Tympanic membrane, ear canal and external ear normal.     Nose: No nasal deformity, septal deviation, mucosal edema or rhinorrhea.     Right Turbinates: Not enlarged, swollen or pale.     Left Turbinates: Not enlarged, swollen or pale.     Right Sinus: No maxillary sinus tenderness or frontal sinus tenderness.     Left Sinus: No maxillary sinus tenderness or frontal sinus tenderness.     Mouth/Throat:     Mouth: Mucous membranes are not pale and not dry.     Pharynx: Uvula midline.  Eyes:     General: Lids are normal. No allergic shiner.       Right eye: No discharge.        Left eye: No discharge.     Conjunctiva/sclera: Conjunctivae normal.     Right eye: Right conjunctiva is not injected. No chemosis.    Left eye: Left conjunctiva is not injected. No chemosis.    Pupils: Pupils are equal, round, and reactive to light.  Cardiovascular:     Rate and Rhythm: Normal rate and regular rhythm.     Heart sounds: Normal heart sounds.  Pulmonary:     Effort: Pulmonary effort is normal. No tachypnea, accessory muscle usage or respiratory distress.     Breath sounds: Normal breath  sounds. No wheezing, rhonchi or rales.  Chest:     Chest wall: No tenderness.  Lymphadenopathy:     Cervical: No cervical adenopathy.  Skin:    Coloration: Skin is not pale.     Findings: No abrasion, erythema, petechiae or rash. Rash is not papular, urticarial or vesicular.  Neurological:     Mental Status: He is alert.  Psychiatric:        Behavior: Behavior is cooperative.     Diagnostic studies: labs sent instead        , MD  [facility]

## 2021-09-10 NOTE — Patient Instructions (Addendum)
1. Seasonal and perennial allergic rhinitis - We will get some blood work to confirm any new allergies. - If this is normal, we will just go ahead and stick with the same from the previous testing. - EpiPen script refilled today. - Sample of Ryaltris provided today.   2. Return in about 1 year (around 09/10/2022).    Please inform us of any Emergency Department visits, hospitalizations, or changes in symptoms. Call us before going to the ED for breathing or allergy symptoms since we might be able to fit you in for a sick visit. Feel free to contact us anytime with any questions, problems, or concerns.  It was a pleasure to see you again today!  Websites that have reliable patient information: 1. American Academy of Asthma, Allergy, and Immunology: www.aaaai.org 2. Food Allergy Research and Education (FARE): foodallergy.org 3. Mothers of Asthmatics: http://www.asthmacommunitynetwork.org 4. American College of Allergy, Asthma, and Immunology: www.acaai.org   COVID-19 Vaccine Information can be found at: [email or web address].[email or web address] For questions related to vaccine distribution or appointments, please email [email or web address] or call [phone].   We realize that you might be concerned about having an allergic reaction to the COVID19 vaccines. To help with that concern, WE ARE OFFERING THE COVID19 VACCINES IN OUR OFFICE! Ask the front desk for dates!     Like [place] on [employer] and Instagram for our latest updates!      A healthy democracy works best when [employer] participate! Make sure you are registered to vote! If you have moved or changed any of your contact information, you will need to get this updated before voting!  In some cases, you MAY be able to register to vote online: [email or web address]

## 2021-09-14 LAB — ALLERGENS W/COMP RFLX AREA 2
Alternaria Alternata IgE: <0.1 kU/L
Aspergillus Fumigatus IgE: <0.1 kU/L
Bermuda Grass IgE: 1.19 kU/L — AB
Cedar, Mountain IgE: <0.1 kU/L
Cladosporium Herbarum IgE: <0.1 kU/L
Cockroach, German IgE: <0.1 kU/L
Common Silver Birch IgE: <0.1 kU/L
Cottonwood IgE: <0.1 kU/L
D Farinae IgE: 3.73 kU/L — AB
D Pteronyssinus IgE: 2.45 kU/L — AB
E001-IgE Cat Dander: 0.38 kU/L — AB
E005-IgE Dog Dander: <0.1 kU/L
Elm, American IgE: <0.1 kU/L
IgE (Immunoglobulin E), Serum: 64 IU/mL (ref 6–495)
Johnson Grass IgE: 0.71 kU/L — AB
Maple/Box Elder IgE: <0.1 kU/L
Mouse Urine IgE: <0.1 kU/L
Oak, White IgE: <0.1 kU/L
Pecan, Hickory IgE: <0.1 kU/L
Penicillium Chrysogen IgE: <0.1 kU/L
Pigweed, Rough IgE: <0.1 kU/L
Ragweed, Short IgE: <0.1 kU/L
Sheep Sorrel IgE Qn: <0.1 kU/L
Timothy Grass IgE: 4.05 kU/L — AB
White Mulberry IgE: <0.1 kU/L

## 2021-09-14 LAB — PANEL 606578
E094-IgE Fel d 1: 0.51 kU/L — AB
E220-IgE Fel d 2: <0.1 kU/L
E228-IgE Fel d 4: <0.1 kU/L

## 2021-09-14 LAB — ALLERGEN COMPONENT COMMENTS

## 2021-11-04 ENCOUNTER — Telehealth: Payer: Self-pay | Admitting: *Deleted

## 2021-11-04 NOTE — Telephone Encounter (Signed)
Need to confirm with Dr. Ernst Bowler  which Allergy Vial and dose for patient to restart allergy injections after his last visit and labs. ?After which vial to restart with has been confirmed, appointment to restart will be made and note will be sent to [facility] to make appropriate vials.  ?

## 2021-11-05 NOTE — Telephone Encounter (Signed)
I reached out to the patient to see when his last shot was and I will make a determination from there.  ? ?Malachi Bonds, MD ?[facility] ? ?

## 2021-11-05 NOTE — Telephone Encounter (Signed)
Reviewed the last dosing form. He last got Red in December 2022, so it has been around 3 months since his last shot.  Lets start him at Green 0.025 mL and advance on Schedule A.  ? ?Thanks!  ? ?Salvatore Marvel, MD ?[facility] [place] [facility] ? ?

## 2021-11-06 DIAGNOSIS — J3089 Other allergic rhinitis: Secondary | ICD-10-CM | POA: Diagnosis not present

## 2021-11-06 NOTE — Progress Notes (Signed)
VIALS EXP 11-07-22 ?

## 2021-11-26 ENCOUNTER — Ambulatory Visit (INDEPENDENT_AMBULATORY_CARE_PROVIDER_SITE_OTHER): Payer: Managed Care, Other (non HMO)

## 2021-11-26 DIAGNOSIS — J309 Allergic rhinitis, unspecified: Secondary | ICD-10-CM | POA: Diagnosis not present

## 2021-11-26 NOTE — Progress Notes (Signed)
Immunotherapy ? ? ?Patient Details  ?Name: Daniel Cooper ?MRN: [id number] ?Date of Birth: 1986-08-09 ? ?11/26/2021 ? ?Quin Hoop Sherbert Cooper restarted injections for Green 1:1000( MOLD and POLLENS-DM-C) ?Following schedule: B  ?Frequency:1 time per week ?Epi-Pen:Epi-Pen Available  ?Consent signed and patient instructions given. Patient will take vials home to get them there by RN. ? ? ?Daniel Cooper ?11/26/2021, 1:38 PM ? ? ?

## 2022-03-19 ENCOUNTER — Ambulatory Visit: Payer: Managed Care, Other (non HMO)

## 2022-03-19 DIAGNOSIS — J309 Allergic rhinitis, unspecified: Secondary | ICD-10-CM | POA: Diagnosis not present

## 2022-03-19 NOTE — Progress Notes (Signed)
Immunotherapy   Patient Details  Name: Daniel Cooper MRN: [id number] Date of Birth: [birth year]  03/19/2022  Daniel Cooper here to pick up Green 1:1000(MOLD and POLLEN-DM-C) Following schedule: B  Frequency:1 time per week Epi-Pen:Epi-Pen Available  Consent signed and patient instructions given. Getting injections at home by RN.   Daniel Cooper Daniel Cooper 03/19/2022, 11:40 AM

## 2022-08-04 ENCOUNTER — Ambulatory Visit: Payer: Managed Care, Other (non HMO)

## 2022-08-08 ENCOUNTER — Ambulatory Visit (INDEPENDENT_AMBULATORY_CARE_PROVIDER_SITE_OTHER): Payer: Managed Care, Other (non HMO)

## 2022-08-08 DIAGNOSIS — J309 Allergic rhinitis, unspecified: Secondary | ICD-10-CM

## 2022-08-08 NOTE — Progress Notes (Signed)
Immunotherapy   Patient Details  Name: Daniel Cooper MRN: [id number] Date of Birth: February 21, 1986  08/08/2022  Ellin Mayhew Rayson III here to pick up Red 1:100 (MOLD AND POLLEN-DM-C) Following schedule: A  Frequency:1 time per week Epi-Pen:Epi-Pen Available  Consent signed and patient instructions given.   Sharnita Bogucki J Talyia Allende 08/08/2022, 10:58 AM

## 2022-12-18 DIAGNOSIS — J3089 Other allergic rhinitis: Secondary | ICD-10-CM | POA: Diagnosis not present

## 2022-12-18 NOTE — Progress Notes (Signed)
VIALS MADE 12/22/23

## 2022-12-22 ENCOUNTER — Telehealth: Payer: Self-pay

## 2022-12-22 NOTE — Telephone Encounter (Signed)
Tried calling ot to explain its been over a year since we sow him and he is due for visit before moving forward with starting injections and to call us back to get on schedule. Phone rang like 5 times then sorry for the diconnect. Will try again later

## 2022-12-22 NOTE — Telephone Encounter (Signed)
Let's schedule him an office visit.

## 2022-12-22 NOTE — Telephone Encounter (Signed)
Patient will need office visit.  Also need to discuss wife giving patient his injections at home.  This is no longer allowed per office protocol.

## 2022-12-23 NOTE — Telephone Encounter (Signed)
He does need an office visit, but I am fine continuing with home administration. He has been on maintenance for a while now and [facility] worked here, so she knows correct administration of the allergy shots.   Malachi Bonds, MD [facility]

## 2022-12-24 NOTE — Telephone Encounter (Signed)
Beth please Refer to these message conversations. Regarding patient doing allergy injections still at home. Technique vs. Safety thank you

## 2023-01-13 NOTE — Telephone Encounter (Signed)
I called the patient's wife and explained the situation.  He is going to give his allergy shots at his health at work clinic which is staffed by an Charity fundraiser.  Wife is going to make an appointment for [facility].  Daniel Bonds, MD [facility] [place]

## 2023-01-13 NOTE — Telephone Encounter (Signed)
Providers in [facility] location are following strict office protocol that went into effect over a year ago.  Patient must be getting allergy injections in our offices or other medical facility.  They no longer allow home administrations.  Patient will be notified of that along with the fact that he needs yearly visits.

## 2023-01-22 NOTE — Progress Notes (Signed)
VIALS REMIXED.  INADVERTENTLY RETURNED TO [facility] OFFICE  NOT REFRIGERATED.

## 2023-01-27 ENCOUNTER — Telehealth: Payer: Self-pay

## 2023-01-27 ENCOUNTER — Encounter: Payer: Self-pay | Admitting: Allergy & Immunology

## 2023-01-27 ENCOUNTER — Ambulatory Visit: Payer: Managed Care, Other (non HMO) | Admitting: Allergy & Immunology

## 2023-01-27 ENCOUNTER — Ambulatory Visit: Payer: Managed Care, Other (non HMO)

## 2023-01-27 ENCOUNTER — Other Ambulatory Visit: Payer: Self-pay

## 2023-01-27 ENCOUNTER — Other Ambulatory Visit (HOSPITAL_COMMUNITY): Payer: Self-pay

## 2023-01-27 VITALS — BP 138/86 | HR 72 | Temp 98.3°F | Resp 16 | Ht 69.5 in | Wt 184.2 lb

## 2023-01-27 DIAGNOSIS — D141 Benign neoplasm of larynx: Secondary | ICD-10-CM | POA: Diagnosis not present

## 2023-01-27 DIAGNOSIS — J069 Acute upper respiratory infection, unspecified: Secondary | ICD-10-CM | POA: Diagnosis not present

## 2023-01-27 DIAGNOSIS — R490 Dysphonia: Secondary | ICD-10-CM

## 2023-01-27 DIAGNOSIS — J302 Other seasonal allergic rhinitis: Secondary | ICD-10-CM

## 2023-01-27 DIAGNOSIS — J3089 Other allergic rhinitis: Secondary | ICD-10-CM | POA: Diagnosis not present

## 2023-01-27 MED ORDER — EPINEPHRINE 0.3 MG/0.3ML IJ SOAJ: 0.3000 mg | INTRAMUSCULAR | 2 refills | Status: DC | PRN

## 2023-01-27 MED ORDER — EPINEPHRINE 0.3 MG/0.3ML IJ SOAJ: 0.3000 mg | INTRAMUSCULAR | 2 refills | Status: AC | PRN

## 2023-01-27 NOTE — Progress Notes (Signed)
FOLLOW UP  Date of Service/Encounter:  01/27/23   Assessment:   Seasonal and perennial allergic rhinitis (trees, weeds, grasses, molds, dust mites and cat) - on allergen immunotherapy with maintenance reached 2019   Chronic migraines - resolved with changing to a dayshift   Nasal congestion - with marked improvement with allergen immunotherapy  Plan/Recommendations:   1. Seasonal and perennial allergic rhinitis - Let's give you time to get over your cold before we restart shots. - Call me if we need to start antibiotics.  - Make an appointment to restart next week. - We are going to start at the [facility] and advance from there.  - Forms sent home to take to your workplace and get signed.  - EpiPen script refilled today.  2. Return in about 1 year (around 01/27/2024).     Subjective:   Daniel Cooper is a 37 y.o. male presenting today for follow up of  Chief Complaint  Patient presents with   Follow-up    Daniel Cooper has a history of the following: Patient Active Problem List   Diagnosis Date Noted   Appendicitis 01/04/2020   Seasonal and perennial allergic rhinitis 09/14/2017   Migraine 09/14/2017   Gastroesophageal reflux disease 09/14/2017   TTH (tension-type headache) 02/17/2017   Recurrent sinus infections 01/01/2017   Frequent headaches 01/01/2017   Health maintenance examination 01/23/2015   Skin rash 01/23/2015   Non-traumatic rhabdomyolysis 09/07/2014   Non-seasonal allergic rhinitis due to pollen 11/28/2010    History obtained from: chart review and patient.  Daniel Cooper is a 37 y.o. male presenting for a follow up visit.  He was last seen in January 2023.  At that time, we got the blood work to confirm any new allergies.  We gave him a sample of Ryaltris to use.  His lab work was notable for sensitization to dust mite, cat and grasses.  His current vials including molds and 1 vial and grasses, ragweed, weeds, trees, cat, and dust mite and  another vial.  Since last visit, he did get married.  This was October 2023.  They are married at [employer] in [place].  Allergic Rhinitis Symptom History: He is just using over the counter medications for management at this point. He does need a new EpiPen. He is going to be getting his injections at his workplace.  Last injection was at the end of April and was 0.3 mL of his Daniel Cooper is on allergen immunotherapy. He receives two injections. Immunotherapy script #1 contains  ragweed, trees, weeds, grasses, and dust mites. He currently receives 0.73mL of the Daniel vial (1/100). Immunotherapy script #2 contains molds. He currently receives 0.75mL of the Daniel vial (1/100). He started shots November of 2018 and reached maintenance sometime in 2019. He receives his injections at an outside facility with our dosing parameters.    He does have a cold right, but the hoarseness is from his vocal cord issues. He went to three concerts in a row and he lost his voice for 6 months. He had a recent procedure to look at vocal cord lesions.  He was diagnosed with a vocal cord papilloma.  It was negative for high-grade dysplasia. There was concern for cancer at first.   He is now seeing Dr. Harriette Cooper who is going to remove the papillomas from his vocal cords. He is going back six weeks later for an additional surgery. He won't be allowed to talk  for 1-2 weeks.   He did chew tobacco and stopped years ago. He does not smoke at all. Work is going well. He remains at [employer] which he seems to be enjoying. He is going to be seeing Daniel Cooper in September. But he is not going to yell like he used to  Otherwise, there have been no changes to his past medical history, surgical history, family history, or social history.    Review of Systems  Constitutional: Negative.  Negative for chills, fever, malaise/fatigue and weight loss.  HENT:  Positive for congestion and sinus pain. Negative for ear  discharge and ear pain.   Eyes:  Negative for pain, discharge and redness.  Respiratory:  Negative for cough, sputum production, shortness of breath and wheezing.   Cardiovascular: Negative.  Negative for chest pain and palpitations.  Gastrointestinal:  Negative for abdominal pain, constipation, diarrhea, heartburn, nausea and vomiting.  Skin: Negative.  Negative for itching and rash.  Neurological:  Negative for dizziness and headaches.  Endo/Heme/Allergies:  Negative for environmental allergies. Does not bruise/bleed easily.       Objective:   Blood pressure 138/86, pulse 72, temperature 98.3 F (36.8 C), resp. rate 16, height 5' 9.5" (1.765 m), weight 184 lb 3 oz (83.5 kg), SpO2 96 %. Body mass index is 26.81 kg/m.    Physical Exam Vitals reviewed.  Constitutional:      Appearance: He is well-developed.     Comments: Talkative.  HENT:     Head: Normocephalic and atraumatic.     Right Ear: Tympanic membrane, ear canal and external ear normal.     Left Ear: Tympanic membrane, ear canal and external ear normal.     Nose: No nasal deformity, septal deviation, mucosal edema or rhinorrhea.     Right Turbinates: Not enlarged, swollen or pale.     Left Turbinates: Not enlarged, swollen or pale.     Right Sinus: No maxillary sinus tenderness or frontal sinus tenderness.     Left Sinus: No maxillary sinus tenderness or frontal sinus tenderness.     Mouth/Throat:     Mouth: Mucous membranes are not pale and not dry.     Pharynx: Uvula midline.  Eyes:     General: Lids are normal. No allergic shiner.       Right eye: No discharge.        Left eye: No discharge.     Conjunctiva/sclera: Conjunctivae normal.     Right eye: Right conjunctiva is not injected. No chemosis.    Left eye: Left conjunctiva is not injected. No chemosis.    Pupils: Pupils are equal, round, and reactive to light.  Cardiovascular:     Rate and Rhythm: Normal rate and regular rhythm.     Heart sounds: Normal  heart sounds.  Pulmonary:     Effort: Pulmonary effort is normal. No tachypnea, accessory muscle usage or respiratory distress.     Breath sounds: Normal breath sounds. No wheezing, rhonchi or rales.  Chest:     Chest wall: No tenderness.  Lymphadenopathy:     Cervical: No cervical adenopathy.  Skin:    Coloration: Skin is not pale.     Findings: No abrasion, erythema, petechiae or rash. Rash is not papular, urticarial or vesicular.  Neurological:     Mental Status: He is alert.  Psychiatric:        Behavior: Behavior is cooperative.      Diagnostic studies: none      Malachi Bonds,  MD  [facility]

## 2023-01-27 NOTE — Telephone Encounter (Signed)
Patient Advocate Encounter   Received notification from [employer] that prior authorization is required for EPINEPHrine 0.3MG /0.3ML auto-injectors   Submitted: n/a Key [id number]  PA not submitted at this time. Per test claim, prior authorization is not required and results in a $10.00 co-pay

## 2023-01-27 NOTE — Telephone Encounter (Signed)
Informed pharmacy no pa needed it is covered at a $10 copay

## 2023-01-27 NOTE — Patient Instructions (Addendum)
1. Seasonal and perennial allergic rhinitis - Let's give you time to get over your cold before we restart shots. - Call me if we need to start antibiotics.  - Make an appointment to restart next week. - We are going to start at the [facility] and advance from there.  - Forms sent home to take to your workplace and get signed.  - EpiPen script refilled today.  2. Return in about 1 year (around 01/27/2024).    Please inform us of any Emergency Department visits, hospitalizations, or changes in symptoms. Call us before going to the ED for breathing or allergy symptoms since we might be able to fit you in for a sick visit. Feel free to contact us anytime with any questions, problems, or concerns.  It was a pleasure to see you again today!  Websites that have reliable patient information: 1. American Academy of Asthma, Allergy, and Immunology: www.aaaai.org 2. Food Allergy Research and Education (FARE): foodallergy.org 3. Mothers of Asthmatics: http://www.asthmacommunitynetwork.org 4. American College of Allergy, Asthma, and Immunology: www.acaai.org   COVID-19 Vaccine Information can be found at: PodExchange.nl For questions related to vaccine distribution or appointments, please email [email or web address] or call [phone].   We realize that you might be concerned about having an allergic reaction to the COVID19 vaccines. To help with that concern, WE ARE OFFERING THE COVID19 VACCINES IN OUR OFFICE! Ask the front desk for dates!     "Like" [place] on [employer] and Instagram for our latest updates!      A healthy democracy works best when [employer] participate! Make sure you are registered to vote! If you have moved or changed any of your contact information, you will need to get this updated before voting!  In some cases, you MAY be able to register to vote online:  [email or web address]

## 2023-01-29 NOTE — Telephone Encounter (Signed)
Per dr. Dellis Anes since it has been a while restart at Green 0.05 ml on schedule B. Patient already has the paperwork and wife has been informed as well.

## 2023-02-04 ENCOUNTER — Ambulatory Visit: Payer: Managed Care, Other (non HMO)

## 2023-02-12 ENCOUNTER — Ambulatory Visit: Payer: Managed Care, Other (non HMO)

## 2023-02-12 DIAGNOSIS — J309 Allergic rhinitis, unspecified: Secondary | ICD-10-CM | POA: Diagnosis not present

## 2023-02-12 NOTE — Progress Notes (Signed)
Immunotherapy   Patient Details  Name: Daniel Cooper MRN: [id number] Date of Birth: 10-Jun-1986  02/12/2023  [name] here to pick up  his green vials and got his fist injection in office of 0.93ml in both arms. Following schedule: B  Frequency:1 time per week Epi-Pen:Epi-Pen Available  Consent signed previously and patient instructions given. Patient waited in the lobby for a while without an issue.   Ralene Muskrat 02/12/2023, 2:48 PM

## 2024-01-26 ENCOUNTER — Ambulatory Visit: Payer: Managed Care, Other (non HMO) | Admitting: Allergy & Immunology

## 2024-08-19 ENCOUNTER — Telehealth: Payer: Self-pay

## 2024-08-19 NOTE — Telephone Encounter (Signed)
 Reviewing the last time patient received allergy injections. Looks like patient never started them.
